# Patient Record
Sex: Male | Born: 1960 | Race: White | Hispanic: No | Marital: Married | Smoking: Current every day smoker
Health system: Southern US, Community
[De-identification: ages and names within clinical notes are randomized; demographics above are authoritative.]

## PROBLEM LIST (undated history)

## (undated) HISTORY — PX: NO PAST SURGERIES: SHX2092

---

## 2008-09-20 ENCOUNTER — Emergency Department: Payer: Self-pay | Admitting: Emergency Medicine

## 2008-09-22 ENCOUNTER — Emergency Department: Payer: Self-pay | Admitting: Emergency Medicine

## 2008-09-30 ENCOUNTER — Emergency Department: Payer: Self-pay | Admitting: Emergency Medicine

## 2008-10-05 ENCOUNTER — Emergency Department: Payer: Self-pay | Admitting: Emergency Medicine

## 2013-03-19 ENCOUNTER — Ambulatory Visit: Payer: Self-pay | Admitting: Family Medicine

## 2014-02-04 ENCOUNTER — Emergency Department: Payer: Self-pay | Admitting: Student

## 2014-02-04 LAB — CBC
HCT: 50.5 % (ref 40.0–52.0)
HGB: 16.9 g/dL (ref 13.0–18.0)
MCH: 32.4 pg (ref 26.0–34.0)
MCHC: 33.5 g/dL (ref 32.0–36.0)
MCV: 97 fL (ref 80–100)
PLATELETS: 191 10*3/uL (ref 150–440)
RBC: 5.22 10*6/uL (ref 4.40–5.90)
RDW: 12.1 % (ref 11.5–14.5)
WBC: 6 10*3/uL (ref 3.8–10.6)

## 2014-02-04 LAB — COMPREHENSIVE METABOLIC PANEL
ALK PHOS: 95 U/L
Albumin: 3.9 g/dL (ref 3.4–5.0)
Anion Gap: 8 (ref 7–16)
BUN: 13 mg/dL (ref 7–18)
Bilirubin,Total: 0.3 mg/dL (ref 0.2–1.0)
CALCIUM: 8.7 mg/dL (ref 8.5–10.1)
CREATININE: 0.92 mg/dL (ref 0.60–1.30)
Chloride: 108 mmol/L — ABNORMAL HIGH (ref 98–107)
Co2: 24 mmol/L (ref 21–32)
EGFR (African American): >60
EGFR (Non-African Amer.): >60
GLUCOSE: 78 mg/dL (ref 65–99)
Osmolality: 278 (ref 275–301)
POTASSIUM: 3.8 mmol/L (ref 3.5–5.1)
SGOT(AST): 17 U/L (ref 15–37)
SGPT (ALT): 25 U/L
Sodium: 140 mmol/L (ref 136–145)
TOTAL PROTEIN: 7.8 g/dL (ref 6.4–8.2)

## 2014-02-04 LAB — URINALYSIS, COMPLETE
BLOOD: NEGATIVE
Bacteria: NONE SEEN
Bilirubin,UR: NEGATIVE
GLUCOSE, UR: NEGATIVE mg/dL (ref 0–75)
Ketone: NEGATIVE
Leukocyte Esterase: NEGATIVE
Nitrite: NEGATIVE
Ph: 5 (ref 4.5–8.0)
Protein: NEGATIVE
RBC,UR: <1 /HPF (ref 0–5)
SPECIFIC GRAVITY: 1.013 (ref 1.003–1.030)
Squamous Epithelial: NONE SEEN

## 2014-02-04 LAB — LIPASE, BLOOD: Lipase: 159 U/L (ref 73–393)

## 2014-02-19 ENCOUNTER — Ambulatory Visit: Payer: Self-pay | Admitting: Gastroenterology

## 2014-02-19 LAB — HM COLONOSCOPY

## 2014-03-18 DIAGNOSIS — M47816 Spondylosis without myelopathy or radiculopathy, lumbar region: Secondary | ICD-10-CM | POA: Insufficient documentation

## 2014-03-18 DIAGNOSIS — M4150 Other secondary scoliosis, site unspecified: Secondary | ICD-10-CM

## 2014-03-19 ENCOUNTER — Encounter: Payer: Self-pay | Admitting: *Deleted

## 2014-03-22 ENCOUNTER — Ambulatory Visit: Payer: Self-pay | Admitting: Family Medicine

## 2014-04-01 ENCOUNTER — Ambulatory Visit: Payer: Self-pay | Admitting: General Surgery

## 2014-04-15 ENCOUNTER — Ambulatory Visit: Payer: Self-pay | Admitting: Gastroenterology

## 2014-07-24 NOTE — Consult Note (Signed)
PATIENT NAME:  Christopher Rosario, Thedore MR#:  [id number] DATE OF BIRTH:  1960/06/15  DATE OF CONSULTATION:  02/04/2014  REFERRING PHYSICIAN:   CONSULTING PHYSICIAN:  Quentin Orealph L. Ely III, MD   CHIEF COMPLAINT: Abdominal pain.   BRIEF HISTORY:  Christopher BaileyMark Shannon is a 54 year old gentleman seen in the Emergency Room with a 24-hour history of abdominal pain. The patient's pain is primarily right lower quadrant, right side and right flank. He had no GI symptoms.  He did have one episode of mild diarrhea, but no nausea, vomiting or anorexia.  He ate normally until this morning.  He denied any fever or chills. Pain became intense this afternoon and he presented to the Emergency Room for further evaluation. Denies any other significant GI problems. He has no history of hepatitis, yellow jaundice, pancreatitis, peptic ulcer disease, gallbladder disease or diverticulitis. He has had no previous abdominal surgery.  No previous colonoscopy.  He does not see physicians regularly.   PAST MEDICAL HISTORY:  He has no major medical problems including no history of hypertension, cardiac disease, diabetes or thyroid problems.   MEDICATIONS:  He takes no medications regularly.   ALLERGIES:  He has no medical allergies.   SOCIAL HISTORY:  He is a cigarette smoker smoking pack and a half of cigarettes a day and does drink alcohol regularly and had a drinking problem many years ago. He works as an Personnel officerelectrician.   Approximately 30 years ago as a young man he had a similar episode of right lower quadrant pain which was treated with antibiotics as possible cecal infection.  His symptoms resolved.  He had no further problems.   DIAGNOSTIC DATA:  A CT scan was performed in the Emergency Room and revealed a thickened appendix dilated to 9 mm with some possible periappendiceal stranding. The possibility of an acute early appendicitis was identified and the surgical service was consulted.  White blood cell count was 6000.   REVIEW OF SYSTEMS:   Otherwise unremarkable.  He denies any voiding symptoms or problems.   PHYSICAL EXAMINATION: GENERAL:  He is AN alert, relatively comfortable gentleman in no significant distress.  He denies any abdominal pain at the present time.  VITALS:  Blood pressure 164/100, heart rate 72 and regular.  He is afebrile.  HEENT:  Unremarkable.  No scleral icterus.  No pupillary abnormalities.  NECK:  Supple, nontender with no adenopathy.  CHEST:  Clear with no adventitious sounds.  He has normal pulmonary excursion.  CARDIAC:  No murmurs or gallops to my ear.  He seems to be in normal sinus rhythm. ABDOMEN:  His abdomen is benign with no abdominal tenderness.  No rebound. No guarding. No point tenderness.  It is otherwise soft with active bowel sounds.   EXTREMITIES:  Exam reveals full range of motion. No deformities.  PSYCHIATRIC:  Normal orientation.  Normal affect.   DISCUSSION:  I have independently reviewed his CT scan. He does have some thickness of his appendix with some mild periappendiceal changes. I discussed surgical intervention with him versus possible observation with antibiotic therapy.  He does not want either option at this point.  He feels like he is much improved.  These changes on CT scan could be related to his previous right lower quadrant infection 30 years ago.    I have recommended observation and antibiotic therapy at the very minimum and he has declined.  We will put him on oral antibiotics, pain medicine and discharge him home with instructions to follow up in  our office next week or sooner if his symptoms do not improve.    His family is present for the interview and are aware of the plans and potential risks.    ____________________________ Quentin Ore III, MD rle:jw D: 02/04/2014 13:36:17 ET T: 02/04/2014 13:46:40 ET JOB#: [id number]  cc: Carmie End, MD, <Dictator> Steele Sizer, MD  Quentin Ore MD ELECTRONICALLY SIGNED 02/10/2014 13:46

## 2015-05-04 IMAGING — CT CT ABD-PELV W/ CM
2 of 5 series · 16 of 46 positions shown, 18 images · IV contrast (omnipaque)
Comparison: 02/04/2014

CLINICAL DATA: Right lower quadrant abdominal pain, worsening for 6
weeks with constipation.

EXAM:
CT ABDOMEN AND PELVIS WITH CONTRAST
TECHNIQUE: Multidetector CT imaging of the abdomen and pelvis was performed
using the standard protocol following bolus administration of
intravenous contrast.
CONTRAST:  100 mL Omnipaque 300

[Series 2: routine abd pel with · axial · 0.67mm/px · z∈[-272,+108]mm · 13 of 86 slices shown, 15 images]
[im 5/86  soft-tissue]
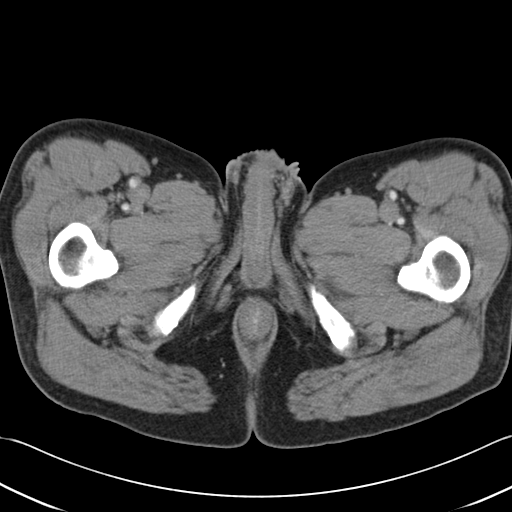
[im 5/86  bone]
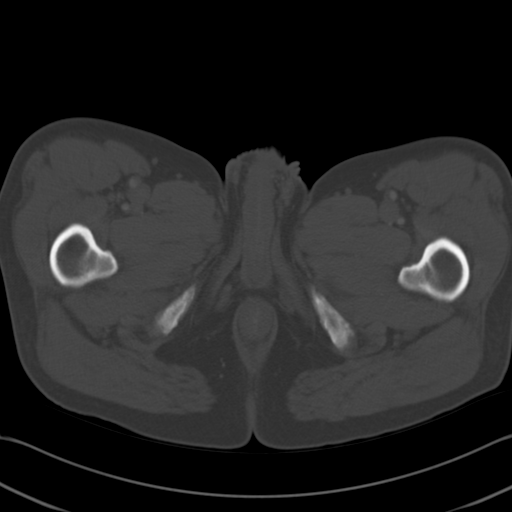
[im 14/86  soft-tissue]
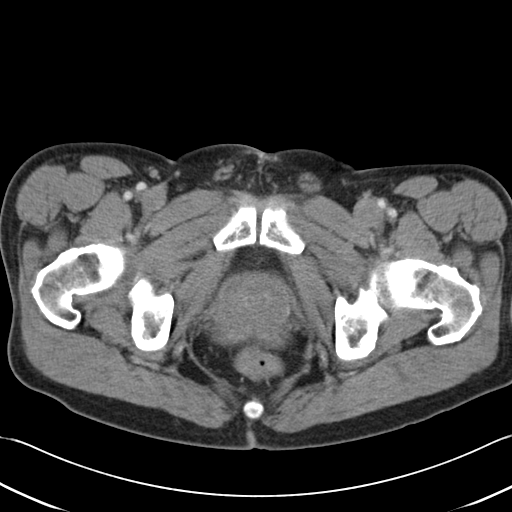
[im 18/86  soft-tissue]
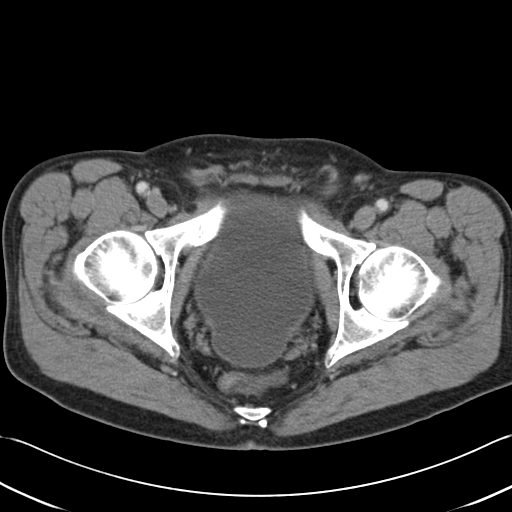
[im 23/86  soft-tissue]
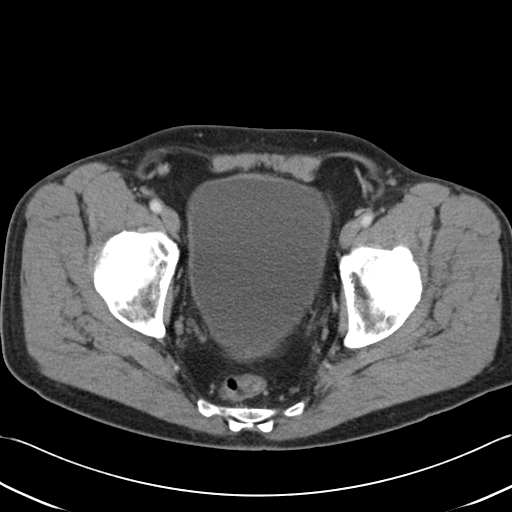
[im 32/86  soft-tissue]
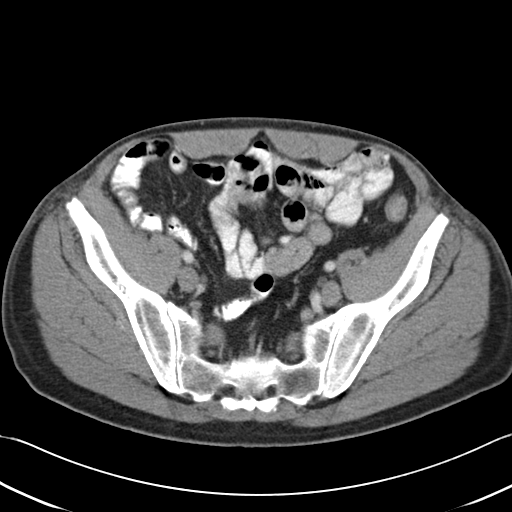
[im 36/86  soft-tissue]
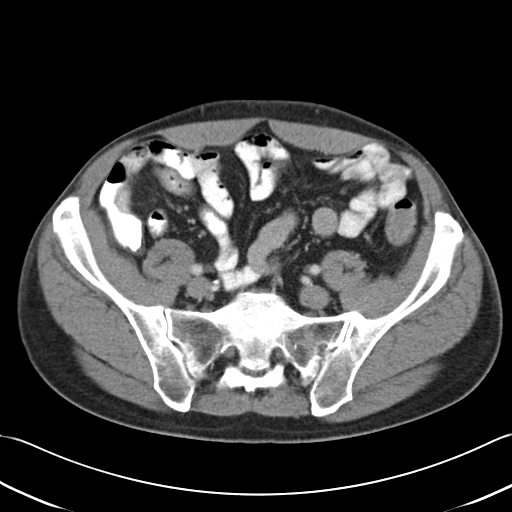
[im 45/86  soft-tissue]
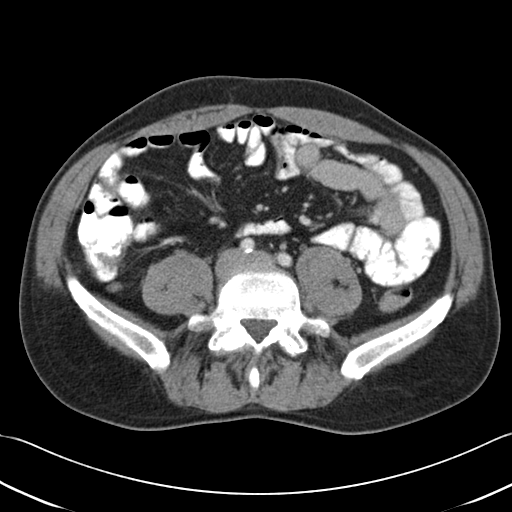
[im 50/86  soft-tissue]
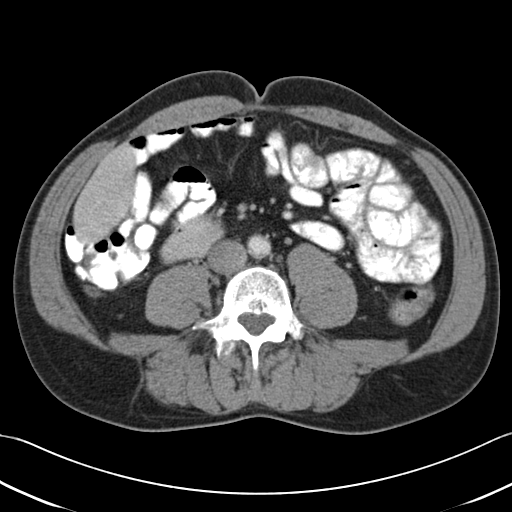
[im 54/86  soft-tissue]
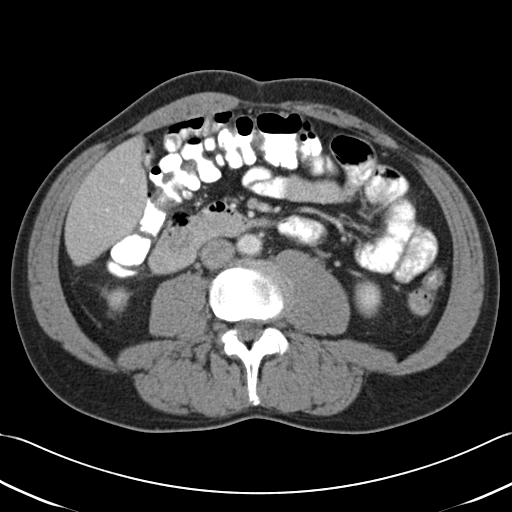
[im 54/86  bone]
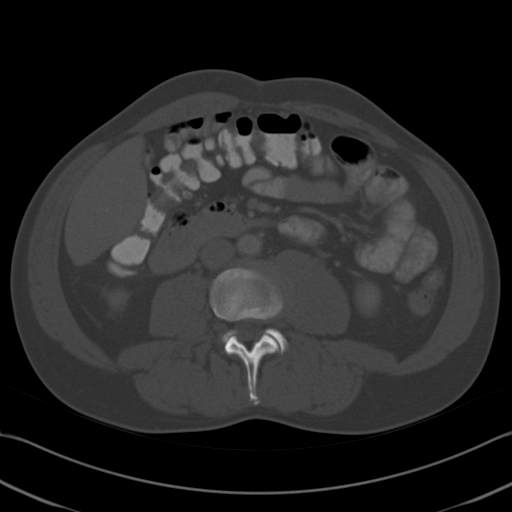
[im 63/86  soft-tissue]
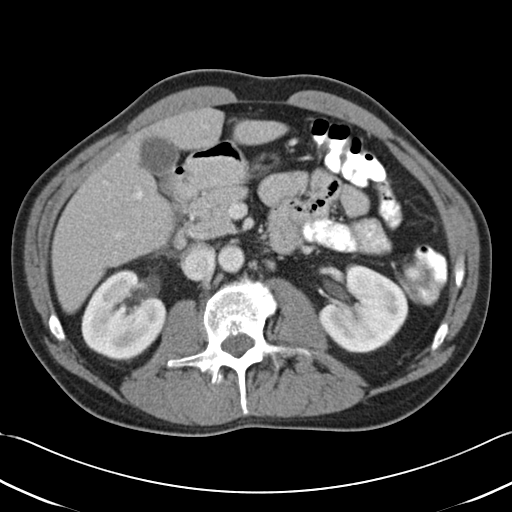
[im 68/86  soft-tissue]
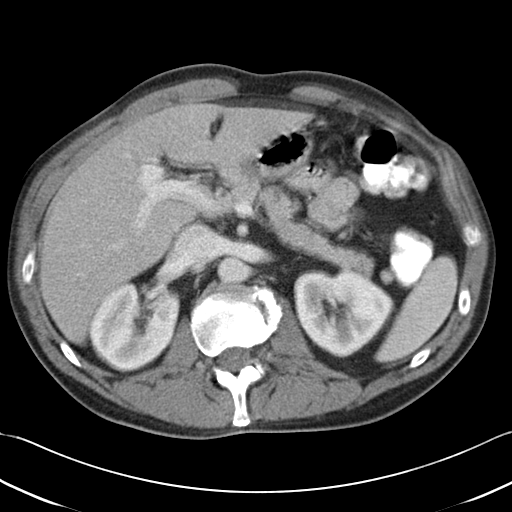
[im 72/86  soft-tissue]
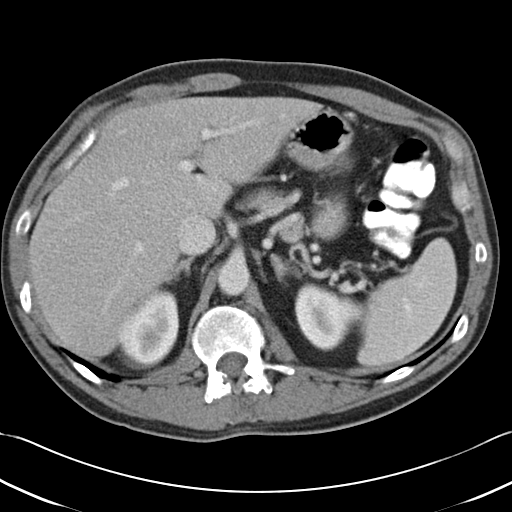
[im 81/86  soft-tissue]
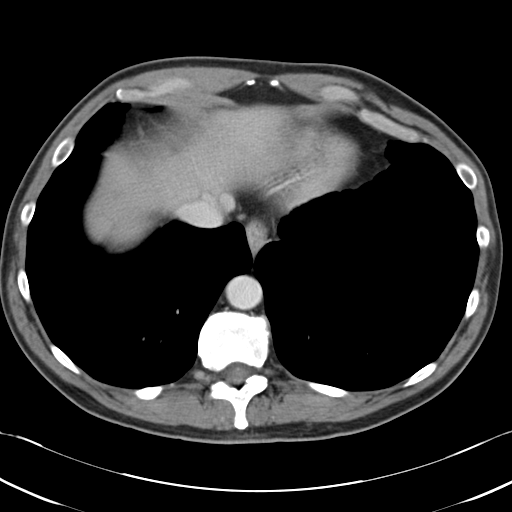

[Series 6: cor routine abd pel with · coronal · 0.70mm/px · 3 of 140 slices shown]
[im 47/140  soft-tissue]
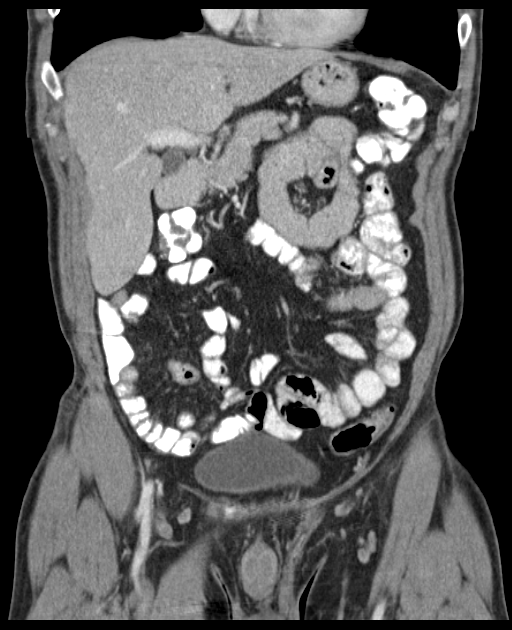
[im 62/140  soft-tissue]
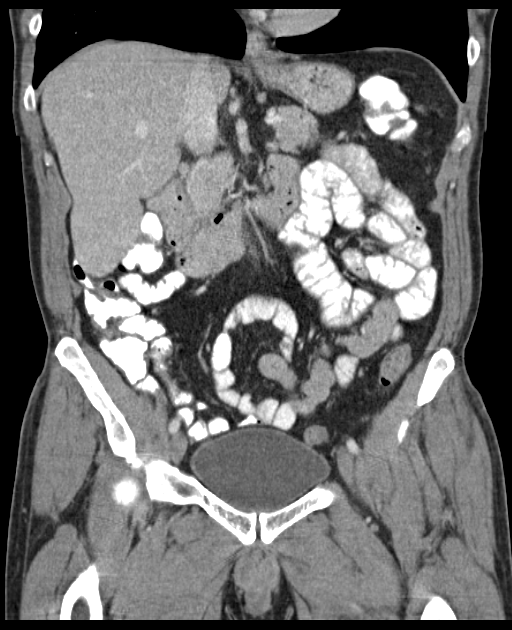
[im 78/140  soft-tissue]
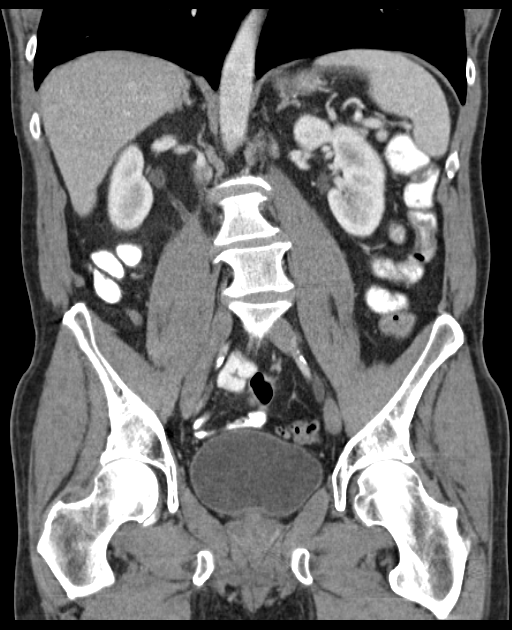

[16 of 46 positions shown; findings below may reference images not displayed]

FINDINGS: Nodules in the left lung base are unchanged measuring 7 mm in the
lingula and 5 mm in the left lower lobe. There is no pleural
effusion.

Diffusely low attenuation of the liver is compatible with mild
steatosis. The gallbladder, spleen, adrenal glands, kidneys, and
pancreas have an unremarkable enhanced appearance.

Mild retroperitoneal stranding about the second portion of the
duodenum is similar to the prior study. There may be an associated
small duodenal diverticulum in this region. There is no evidence of
frank perforation, however a microperforation cannot be completely
excluded given 1 or 2 small locules of gas which cannot be
completely confirmed to be intraluminal.

Oral contrast is present in loops of nondilated small and large
bowel without evidence of obstruction. Mild thickening of the
appendix is similar to the prior study, measuring up to 8 mm in
diameter. No significant surrounding inflammatory changes are
identified. Sigmoid colonic diverticulosis is noted without evidence
of acute diverticulitis.

Mild aortoiliac atherosclerotic calcification is noted. No free
fluid or enlarged lymph nodes are identified. Bladder is
unremarkable. Lumbar spondylosis is noted with mild thoracolumbar
dextroscoliosis.
IMPRESSION: 1. Unchanged, mild thickening of the appendix without significant
surrounding inflammatory changes to clearly indicate acute
appendicitis.
2. Persistent inflammatory stranding about the second portion of the
duodenum, possibly representing duodenitis. Microperforation, such
as from a duodenal ulcer, cannot be completely excluded.

## 2016-12-26 ENCOUNTER — Ambulatory Visit (INDEPENDENT_AMBULATORY_CARE_PROVIDER_SITE_OTHER): Payer: 59 | Admitting: Physician Assistant

## 2016-12-26 VITALS — BP 136/72 | HR 88 | Temp 99.0°F | Resp 16 | Ht 69.0 in | Wt 155.0 lb

## 2016-12-26 DIAGNOSIS — J0110 Acute frontal sinusitis, unspecified: Secondary | ICD-10-CM | POA: Diagnosis not present

## 2016-12-26 DIAGNOSIS — Z87891 Personal history of nicotine dependence: Secondary | ICD-10-CM | POA: Diagnosis not present

## 2016-12-26 DIAGNOSIS — Z720 Tobacco use: Secondary | ICD-10-CM

## 2016-12-26 MED ORDER — AMOXICILLIN 875 MG PO TABS: 875.0000 mg | ORAL_TABLET | Freq: Two times a day (BID) | ORAL | 0 refills | Status: DC

## 2016-12-26 NOTE — Progress Notes (Signed)
[facility]  Chief Complaint  Patient presents with  . Sinusitis    Started five days ago.    Subjective:    Patient ID: Christopher Rosario, male    DOB: [birth year], 56 y.o.   MRN: [id number]  Upper Respiratory Infection: Christopher Rosario is a 56 y.o. male with a past medical history significant for 35 pack year smoking,complaining of symptoms of a URI, possible sinusitis. Symptoms include left ear pain, congestion, cough and sore throat. Onset of symptoms was 5 days ago, gradually worsening since that time. He felt he got better, then got worse after 5 days. He also c/o left ear pressure/pain, congestion, cough described as productive and lightheadedness for the past 1 day .  He is drinking plenty of fluids. Evaluation to date: none. Treatment to date: cough suppressants. The treatment has provided moderate relief..   Review of Systems  Constitutional: Positive for diaphoresis and fatigue. Negative for activity change, appetite change, chills, fever and unexpected weight change.  HENT: Positive for congestion, ear pain (Left ear pressure), postnasal drip, rhinorrhea, sinus pain, sinus pressure, sneezing, sore throat and voice change. Negative for ear discharge, nosebleeds, tinnitus and trouble swallowing.   Eyes: Negative for photophobia, pain, discharge, redness, itching and visual disturbance.  Respiratory: Positive for cough, chest tightness and wheezing. Negative for apnea, choking, shortness of breath and stridor.   Gastrointestinal: Negative.   Allergic/Immunologic: Positive for environmental allergies.  Neurological: Positive for headaches. Negative for dizziness and light-headedness.       Objective:   BP 136/72 (BP Location: Left Arm, Patient Position: Sitting, Cuff Size: Normal)   Pulse 88   Temp 99 F (37.2 C) (Oral)   Resp 16   Ht  (1.753 m)   Wt 155 lb (70.3 kg)   BMI 22.89 kg/m   Patient Active Problem List   Diagnosis Date Noted   . Degenerative scoliosis in adult patient 03/18/2014  . Spondylosis of lumbar region without myelopathy or radiculopathy 03/18/2014    Outpatient Encounter Prescriptions as of 12/26/2016  Medication Sig  . amoxicillin (AMOXIL) 875 MG tablet Take 1 tablet (875 mg total) by mouth 2 (two) times daily.   No facility-administered encounter medications on file as of 12/26/2016.     No Known Allergies     Physical Exam  Constitutional: He is oriented to person, place, and time. He appears well-developed and well-nourished.  HENT:  Mouth/Throat: Oropharynx is clear and moist. No oropharyngeal exudate.  TMs opaque bilaterally.   Eyes: Conjunctivae are normal.  Neck: Neck supple.  Cardiovascular: Normal rate and regular rhythm.   Pulmonary/Chest: Effort normal and breath sounds normal.  Lymphadenopathy:    He has cervical adenopathy.  Neurological: He is alert and oriented to person, place, and time.  Skin: Skin is warm and dry.  Psychiatric: He has a normal mood and affect. His behavior is normal.       Assessment & Plan:   1. Acute non-recurrent frontal sinusitis  - amoxicillin (AMOXIL) 875 MG tablet; Take 1 tablet (875 mg total) by mouth 2 (two) times daily.  Dispense: 20 tablet; Refill: 0  2. Tobacco abuse  35 pack year smoking history, still currently. Discussed for 3 minutes smoking cessation, need for low dose CT screening.   Recommend rest, fluids, frequent hand washing. Work note provided  Patient Instructions  Sinusitis, Adult Sinusitis is soreness and inflammation of your sinuses. Sinuses are hollow spaces in the bones around your face. They are located:  Around your eyes.  In the middle of your forehead.  Behind your nose.  In your cheekbones.  Your sinuses and nasal passages are lined with a stringy fluid (mucus). Mucus normally drains out of your sinuses. When your nasal tissues get inflamed or swollen, the mucus can get trapped or blocked so air cannot  flow through your sinuses. This lets bacteria, viruses, and funguses grow, and that leads to infection. Follow these instructions at home: Medicines  Take, use, or apply over-the-counter and prescription medicines only as told by your doctor. These may include nasal sprays.  If you were prescribed an antibiotic medicine, take it as told by your doctor. Do not stop taking the antibiotic even if you start to feel better. Hydrate and Humidify  Drink enough water to keep your pee (urine) clear or pale yellow.  Use a cool mist humidifier to keep the humidity level in your home above 50%.  Breathe in steam for 10-15 minutes, 3-4 times a day or as told by your doctor. You can do this in the bathroom while a hot shower is running.  Try not to spend time in cool or dry air. Rest  Rest as much as possible.  Sleep with your head raised (elevated).  Make sure to get enough sleep each night. General instructions  Put a warm, moist washcloth on your face 3-4 times a day or as told by your doctor. This will help with discomfort.  Wash your hands often with soap and water. If there is no soap and water, use hand sanitizer.  Do not smoke. Avoid being around people who are smoking (secondhand smoke).  Keep all follow-up visits as told by your doctor. This is important. Contact a doctor if:  You have a fever.  Your symptoms get worse.  Your symptoms do not get better within 10 days. Get help right away if:  You have a very bad headache.  You cannot stop throwing up (vomiting).  You have pain or swelling around your face or eyes.  You have trouble seeing.  You feel confused.  Your neck is stiff.  You have trouble breathing. This information is not intended to replace advice given to you by your health care provider. Make sure you discuss any questions you have with your health care provider. Document Released: 09/05/2007 Document Revised: 11/13/2015 Document Reviewed:  01/12/2015 Elsevier Interactive Patient Education  [employer].     The entirety of the information documented in the History of Present Illness, Review of Systems and Physical Exam were personally obtained by me. Portions of this information were initially documented by Kavin Leech, CMA and reviewed by me for thoroughness and accuracy.

## 2016-12-26 NOTE — Patient Instructions (Signed)

## 2016-12-27 ENCOUNTER — Telehealth: Payer: Self-pay | Admitting: *Deleted

## 2016-12-27 DIAGNOSIS — Z87891 Personal history of nicotine dependence: Secondary | ICD-10-CM

## 2016-12-27 DIAGNOSIS — Z122 Encounter for screening for malignant neoplasm of respiratory organs: Secondary | ICD-10-CM

## 2016-12-27 NOTE — Telephone Encounter (Signed)
Received referral for initial lung cancer screening scan. Contacted patient and obtained smoking history,(current, 35 pack year) as well as answering questions related to screening process. Patient denies signs of lung cancer such as weight loss or hemoptysis. Patient denies comorbidity that would prevent curative treatment if lung cancer were found. Patient is scheduled for shared decision making visit and CT scan on 01/01/17.

## 2016-12-31 ENCOUNTER — Encounter: Payer: Self-pay | Admitting: Physician Assistant

## 2017-01-01 ENCOUNTER — Encounter: Payer: Self-pay | Admitting: Oncology

## 2017-01-01 ENCOUNTER — Ambulatory Visit: Admission: RE | Admit: 2017-01-01 | Payer: 59 | Source: Ambulatory Visit

## 2017-01-01 ENCOUNTER — Inpatient Hospital Stay: Payer: 59 | Admitting: Oncology

## 2017-01-02 ENCOUNTER — Telehealth: Payer: Self-pay | Admitting: *Deleted

## 2017-01-02 NOTE — Telephone Encounter (Signed)
Patient previously scheduled for CT screening scan to be rescheduled per his request.

## 2017-01-09 ENCOUNTER — Ambulatory Visit: Admission: RE | Admit: 2017-01-09 | Payer: 59 | Source: Ambulatory Visit

## 2017-01-09 ENCOUNTER — Inpatient Hospital Stay: Payer: 59 | Attending: Nurse Practitioner | Admitting: Nurse Practitioner

## 2017-01-21 ENCOUNTER — Telehealth: Payer: Self-pay | Admitting: *Deleted

## 2017-01-21 NOTE — Telephone Encounter (Signed)
Attempted to contact patient regarding no show for lung screening appt. Unable to leave voicemail. Will mail letter.

## 2017-03-01 ENCOUNTER — Telehealth: Payer: Self-pay | Admitting: *Deleted

## 2017-03-01 NOTE — Telephone Encounter (Signed)
Received referral for low dose lung cancer screening CT scan. Previous attempts to coordinate Ct and shared decision making visit have been unsuccessful. Attempted to leave message at phone number listed in EMR for patient to call me back to facilitate scheduling scan. However, this option is not available. Will mail notification.

## 2017-03-13 ENCOUNTER — Encounter: Payer: Self-pay | Admitting: *Deleted

## 2018-02-10 ENCOUNTER — Encounter: Payer: Self-pay | Admitting: Physician Assistant

## 2018-02-10 ENCOUNTER — Ambulatory Visit (INDEPENDENT_AMBULATORY_CARE_PROVIDER_SITE_OTHER): Payer: 59 | Admitting: Physician Assistant

## 2018-02-10 VITALS — BP 180/110 | HR 71 | Temp 98.5°F | Wt 155.6 lb

## 2018-02-10 DIAGNOSIS — I10 Essential (primary) hypertension: Secondary | ICD-10-CM | POA: Diagnosis not present

## 2018-02-10 DIAGNOSIS — M542 Cervicalgia: Secondary | ICD-10-CM | POA: Diagnosis not present

## 2018-02-10 DIAGNOSIS — Z1159 Encounter for screening for other viral diseases: Secondary | ICD-10-CM | POA: Diagnosis not present

## 2018-02-10 DIAGNOSIS — Z720 Tobacco use: Secondary | ICD-10-CM

## 2018-02-10 DIAGNOSIS — Z114 Encounter for screening for human immunodeficiency virus [HIV]: Secondary | ICD-10-CM

## 2018-02-10 MED ORDER — VARENICLINE TARTRATE 0.5 MG X 11 & 1 MG X 42 PO MISC: ORAL | 0 refills | Status: DC

## 2018-02-10 MED ORDER — AMLODIPINE BESYLATE 5 MG PO TABS: 5.0000 mg | ORAL_TABLET | Freq: Every day | ORAL | 0 refills | Status: DC

## 2018-02-10 MED ORDER — CYCLOBENZAPRINE HCL 5 MG PO TABS: 5.0000 mg | ORAL_TABLET | Freq: Three times a day (TID) | ORAL | 0 refills | Status: DC | PRN

## 2018-02-10 NOTE — Patient Instructions (Signed)

## 2018-02-10 NOTE — Progress Notes (Signed)
Patient: Christopher Rosario Male    DOB: 09/27/1960   57 y.o.   MRN: [id number] Visit Date: 02/10/2018  Today's Provider: Trey Sailors, PA-C   Chief Complaint  Patient presents with  . Neck Pain   Subjective:    HPI  Neck Pain Patient presents today for right neck pain for about 1 month down to the right shoulder. Patient has been taking Goodiepowders and applying heating pads with significant relief especially after the heating pad. Heavy lifting past Monday and right side of neck bothered him badly and then got better. Worked around house, then describes waking up today with neck locked up. 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 Denies weakness in his right hand, loss of grip strength, dropping items.   Today, his blood pressure is elevated. Denies chest pain and headache. Says sometimes he will check his BP in the cuff at [facility] and it is 140's/90's.   BP Readings from Last 3 Encounters:  02/10/18 (!) 180/110  12/26/16 136/72   Tobacco abuse: He reports he is smoking 1.5 packs per day and has done so for at least 20 years. He qualifies for low dose CT scan which was ordered last year but not obtained. Patient is interested in quitting.     No Known Allergies   Current Outpatient Medications:  .  amoxicillin (AMOXIL) 875 MG tablet, Take 1 tablet (875 mg total) by mouth 2 (two) times daily., Disp: 20 tablet, Rfl: 0 .  amLODipine (NORVASC) 5 MG tablet, Take 1 tablet (5 mg total) by mouth daily., Disp: 90 tablet, Rfl: 0 .  cyclobenzaprine (FLEXERIL) 5 MG tablet, Take 1 tablet (5 mg total) by mouth 3 (three) times daily as needed for muscle spasms., Disp: 30 tablet, Rfl: 0 .  varenicline (CHANTIX STARTING MONTH PAK) 0.5 MG X 11 & 1 MG X 42 tablet, One 0.5 mg tablet by mouth 1x for 3 days, then increase to one 0.5 mg tablet twice daily for 4 days, then increase to one 1 mg tab 2x daily., Disp: 53 tablet, Rfl: 0  Review of Systems  Constitutional: Negative.   HENT:  Negative.   Gastrointestinal: Negative.   Genitourinary: Negative.   Musculoskeletal: Positive for arthralgias, back pain, myalgias, neck pain and neck stiffness.  Allergic/Immunologic: Negative.     Social History   Tobacco Use  . Smoking status: Current Every Day Smoker    Packs/day: 1.00    Years: 35.00    Pack years: 35.00    Types: Cigarettes  . Smokeless tobacco: Never Used  Substance Use Topics  . Alcohol use: Yes    Alcohol/week: 7.0 standard drinks    Types: 7 Cans of beer per week   Objective:   BP (!) 180/110   Pulse 71   Temp 98.5 F (36.9 C) (Oral)   Wt 155 lb 9.6 oz (70.6 kg)   SpO2 97%   BMI 22.98 kg/m  Vitals:   02/10/18 1343 02/10/18 1513  BP: (!) 197/122 (!) 180/110  Pulse: 71   Temp: 98.5 F (36.9 C)   TempSrc: Oral   SpO2: 97%   Weight: 155 lb 9.6 oz (70.6 kg)      Physical Exam  Constitutional: He is oriented to person, place, and time. He appears well-developed and well-nourished.  Cardiovascular: Normal rate and regular rhythm.  Pulmonary/Chest: Effort normal and breath sounds normal.  Musculoskeletal: He exhibits no edema, tenderness or deformity.  Impingement signs negative bilaterally. 5/5 upper  extremity strength bilaterally.   Neurological: He is alert and oriented to person, place, and time.  Skin: Skin is warm and dry.  Psychiatric: He has a normal mood and affect. His behavior is normal.        Assessment & Plan:     1. Neck pain  Suspect muscular. Muscle relaxers as needed, advised to continue with warm heat. Printed out neck stretching exercises.  - cyclobenzaprine (FLEXERIL) 5 MG tablet; Take 1 tablet (5 mg total) by mouth 3 (three) times daily as needed for muscle spasms.  Dispense: 30 tablet; Refill: 0  2. Hypertension, unspecified type  Extremely elevated today and on recheck. Patient says BP's in [facility] red 140's/90's and so will treat for HTN.  - Comprehensive Metabolic Panel (CMET) - Lipid Profile - CBC with  Differential - amLODipine (NORVASC) 5 MG tablet; Take 1 tablet (5 mg total) by mouth daily.  Dispense: 90 tablet; Refill: 0  3. Tobacco abuse  Counseled >3 min about smoking cessation and have decided to start on Chantix.   - varenicline (CHANTIX STARTING MONTH PAK) 0.5 MG X 11 & 1 MG X 42 tablet; One 0.5 mg tablet by mouth 1x for 3 days, then increase to one 0.5 mg tablet twice daily for 4 days, then increase to one 1 mg tab 2x daily.  Dispense: 53 tablet; Refill: 0  4. Need for hepatitis C screening test  - Hepatitis C antibody  5. Encounter for screening for HIV  - HIV antibody (with reflex)  Return in about 2 weeks (around 02/24/2018) for htn.  The entirety of the information documented in the History of Present Illness, Review of Systems and Physical Exam were personally obtained by me. Portions of this information were initially documented by Dara Lords, CMA and reviewed by me for thoroughness and accuracy.           Trey Sailors, PA-C  [facility]

## 2018-02-11 ENCOUNTER — Telehealth: Payer: Self-pay

## 2018-02-11 LAB — CBC WITH DIFFERENTIAL/PLATELET
Basophils Absolute: 0 10*3/uL (ref 0.0–0.2)
Basos: 1 %
EOS (ABSOLUTE): 0.1 10*3/uL (ref 0.0–0.4)
Eos: 2 %
Hematocrit: 48.7 % (ref 37.5–51.0)
Hemoglobin: 16.9 g/dL (ref 13.0–17.7)
Immature Grans (Abs): 0 10*3/uL (ref 0.0–0.1)
Immature Granulocytes: 0 %
Lymphocytes Absolute: 1.6 10*3/uL (ref 0.7–3.1)
Lymphs: 25 %
MCH: 32.6 pg (ref 26.6–33.0)
MCHC: 34.7 g/dL (ref 31.5–35.7)
MCV: 94 fL (ref 79–97)
Monocytes Absolute: 0.6 10*3/uL (ref 0.1–0.9)
Monocytes: 10 %
Neutrophils Absolute: 4.1 10*3/uL (ref 1.4–7.0)
Neutrophils: 62 %
Platelets: 217 10*3/uL (ref 150–450)
RBC: 5.19 x10E6/uL (ref 4.14–5.80)
RDW: 11.6 % — ABNORMAL LOW (ref 12.3–15.4)
WBC: 6.5 10*3/uL (ref 3.4–10.8)

## 2018-02-11 LAB — COMPREHENSIVE METABOLIC PANEL
ALT: 15 IU/L (ref 0–44)
AST: 17 IU/L (ref 0–40)
Albumin/Globulin Ratio: 1.8 (ref 1.2–2.2)
Albumin: 4.6 g/dL (ref 3.5–5.5)
Alkaline Phosphatase: 94 IU/L (ref 39–117)
BUN/Creatinine Ratio: 16 (ref 9–20)
BUN: 19 mg/dL (ref 6–24)
Bilirubin Total: <0.2 mg/dL (ref 0.0–1.2)
CO2: 18 mmol/L — ABNORMAL LOW (ref 20–29)
Calcium: 9.6 mg/dL (ref 8.7–10.2)
Chloride: 105 mmol/L (ref 96–106)
Creatinine, Ser: 1.22 mg/dL (ref 0.76–1.27)
GFR calc Af Amer: 76 mL/min/{1.73_m2} (ref >59)
GFR calc non Af Amer: 65 mL/min/{1.73_m2} (ref >59)
Globulin, Total: 2.6 g/dL (ref 1.5–4.5)
Glucose: 98 mg/dL (ref 65–99)
Potassium: 4.3 mmol/L (ref 3.5–5.2)
Sodium: 143 mmol/L (ref 134–144)
Total Protein: 7.2 g/dL (ref 6.0–8.5)

## 2018-02-11 LAB — HIV ANTIBODY (ROUTINE TESTING W REFLEX): HIV Screen 4th Generation wRfx: NONREACTIVE

## 2018-02-11 LAB — HEPATITIS C ANTIBODY: Hep C Virus Ab: <0.1 s/co ratio (ref 0.0–0.9)

## 2018-02-11 LAB — LIPID PANEL
Chol/HDL Ratio: 4.4 ratio (ref 0.0–5.0)
Cholesterol, Total: 262 mg/dL — ABNORMAL HIGH (ref 100–199)
HDL: 59 mg/dL (ref >39)
LDL Calculated: 165 mg/dL — ABNORMAL HIGH (ref 0–99)
Triglycerides: 191 mg/dL — ABNORMAL HIGH (ref 0–149)
VLDL Cholesterol Cal: 38 mg/dL (ref 5–40)

## 2018-02-11 NOTE — Telephone Encounter (Signed)
-----   Message from Trey SailorsAdriana M Pollak, [place]PA-C sent at 02/11/2018  8:29 AM EST ----- CMET shows normal sugars, kidney and liver function. CBC normal. Cholesterol very elevated, will have to talk about cholesterol medication at next follow up. HIV and hepatitis C are negative.

## 2018-02-12 NOTE — Telephone Encounter (Signed)
Patient advised as below.  

## 2018-02-24 ENCOUNTER — Encounter: Payer: Self-pay | Admitting: Physician Assistant

## 2018-02-24 ENCOUNTER — Ambulatory Visit (INDEPENDENT_AMBULATORY_CARE_PROVIDER_SITE_OTHER): Payer: 59 | Admitting: Physician Assistant

## 2018-02-24 VITALS — BP 150/90 | HR 72 | Temp 97.8°F | Resp 20 | Wt 160.0 lb

## 2018-02-24 DIAGNOSIS — I10 Essential (primary) hypertension: Secondary | ICD-10-CM | POA: Insufficient documentation

## 2018-02-24 DIAGNOSIS — E785 Hyperlipidemia, unspecified: Secondary | ICD-10-CM

## 2018-02-24 HISTORY — DX: Essential (primary) hypertension: I10

## 2018-02-24 MED ORDER — AMLODIPINE BESYLATE 10 MG PO TABS: 10.0000 mg | ORAL_TABLET | Freq: Every day | ORAL | 0 refills | Status: DC

## 2018-02-24 MED ORDER — ATORVASTATIN CALCIUM 10 MG PO TABS: 10.0000 mg | ORAL_TABLET | Freq: Every day | ORAL | 0 refills | Status: DC

## 2018-02-24 NOTE — Patient Instructions (Signed)

## 2018-02-24 NOTE — Progress Notes (Signed)
Established Patient Office Visit  Subjective:  Patient ID: Christopher Rosario, male    DOB: 1960-07-09  Age: 57 y.o. MRN: [id number]  CC:  Chief Complaint  Patient presents with  . Follow-up    HPI Christopher Rosario presents for   Hypertension, follow-up:  BP Readings from Last 3 Encounters:  02/24/18 (!) 150/90  02/10/18 (!) 180/110  12/26/16 136/72    He was last seen for hypertension 2 weeks ago.  BP at that visit was 180/110. Management changes since that visit include starting amlodipine 5 mg.  He reports excellent compliance with treatment. He is having side effects. Dry mouth He is not exercising. He is not adherent to low salt diet.   Outside blood pressures are still elevated. He is experiencing none.  Patient denies chest pain and lower extremity edema.   Cardiovascular risk factors include hypertension and smoking/ tobacco exposure.  Use of agents associated with hypertension: none.     Weight trend: stable Wt Readings from Last 3 Encounters:  02/24/18 160 lb (72.6 kg)  02/10/18 155 lb 9.6 oz (70.6 kg)  12/26/16 155 lb (70.3 kg)    Current diet: in general, a "healthy" diet    HLD  The 10-year ASCVD risk score Denman George DC Jr., et al., 2013) is: 20.1%   Values used to calculate the score:     Age: 75 years     Sex: Male     Is Non-Hispanic African American: No     Diabetic: No     Tobacco smoker: Yes     Systolic Blood Pressure: 150 mmHg     Is BP treated: Yes     HDL Cholesterol: 59 mg/dL     Total Cholesterol: 262 mg/dL   Lipid Panel     Component Value Date/Time   CHOL 262 (H) 02/10/2018 1424   TRIG 191 (H) 02/10/2018 1424   HDL 59 02/10/2018 1424   CHOLHDL 4.4 02/10/2018 1424   LDLCALC 165 (H) 02/10/2018 1424   Patient continues to smoke 1 pack a day. Patient drinks 2 alcoholic drinks per night.  ------------------------------------------------------------------------    No past medical history on file.  Past Surgical History:  Procedure  Laterality Date  . NO PAST SURGERIES      Family History  Problem Relation Age of Onset  . Stroke Mother   . Cancer Mother   . Breast cancer Mother   . Aneurysm Father   . Hypertension Father   . Healthy Sister   . Prostate cancer Maternal Uncle     Social History   Socioeconomic History  . Marital status: Married    Spouse name: Not on file  . Number of children: Not on file  . Years of education: Not on file  . Highest education level: Not on file  Occupational History  . Not on file  Social Needs  . Financial resource strain: Not on file  . Food insecurity:    Worry: Not on file    Inability: Not on file  . Transportation needs:    Medical: Not on file    Non-medical: Not on file  Tobacco Use  . Smoking status: Current Every Day Smoker    Packs/day: 1.00    Years: 35.00    Pack years: 35.00    Types: Cigarettes  . Smokeless tobacco: Never Used  Substance and Sexual Activity  . Alcohol use: Yes    Alcohol/week: 7.0 standard drinks    Types: 7 Cans of beer per  week  . Drug use: No  . Sexual activity: Not on file  Lifestyle  . Physical activity:    Days per week: Not on file    Minutes per session: Not on file  . Stress: Not on file  Relationships  . Social connections:    Talks on phone: Not on file    Gets together: Not on file    Attends religious service: Not on file    Active member of club or organization: Not on file    Attends meetings of clubs or organizations: Not on file    Relationship status: Not on file  . Intimate partner violence:    Fear of current or ex partner: Not on file    Emotionally abused: Not on file    Physically abused: Not on file    Forced sexual activity: Not on file  Other Topics Concern  . Not on file  Social History Narrative  . Not on file    Outpatient Medications Prior to Visit  Medication Sig Dispense Refill  . varenicline (CHANTIX STARTING MONTH PAK) 0.5 MG X 11 & 1 MG X 42 tablet One 0.5 mg tablet by mouth  1x for 3 days, then increase to one 0.5 mg tablet twice daily for 4 days, then increase to one 1 mg tab 2x daily. 53 tablet 0  . amLODipine (NORVASC) 5 MG tablet Take 1 tablet (5 mg total) by mouth daily. 90 tablet 0  . amoxicillin (AMOXIL) 875 MG tablet Take 1 tablet (875 mg total) by mouth 2 (two) times daily. 20 tablet 0  . cyclobenzaprine (FLEXERIL) 5 MG tablet Take 1 tablet (5 mg total) by mouth 3 (three) times daily as needed for muscle spasms. 30 tablet 0   No facility-administered medications prior to visit.     No Known Allergies  ROS Review of Systems  Constitutional: Negative.   Respiratory: Negative.   Cardiovascular: Negative.       Objective:    Physical Exam  BP (!) 150/90 (BP Location: Left Arm, Patient Position: Sitting, Cuff Size: Normal)   Pulse 72   Temp 97.8 F (36.6 C) (Oral)   Resp 20   Wt 160 lb (72.6 kg)   SpO2 96%   BMI 23.63 kg/m  Wt Readings from Last 3 Encounters:  02/24/18 160 lb (72.6 kg)  02/10/18 155 lb 9.6 oz (70.6 kg)  12/26/16 155 lb (70.3 kg)     Health Maintenance Due  Topic Date Due  . TETANUS/TDAP  08/21/1979  . INFLUENZA VACCINE  10/31/2017    There are no preventive care reminders to display for this patient.  No results found for: TSH Lab Results  Component Value Date   WBC 6.5 02/10/2018   HGB 16.9 02/10/2018   HCT 48.7 02/10/2018   MCV 94 02/10/2018   PLT 217 02/10/2018   Lab Results  Component Value Date   NA 143 02/10/2018   K 4.3 02/10/2018   CO2 18 (L) 02/10/2018   GLUCOSE 98 02/10/2018   BUN 19 02/10/2018   CREATININE 1.22 02/10/2018   BILITOT <0.2 02/10/2018   ALKPHOS 94 02/10/2018   AST 17 02/10/2018   ALT 15 02/10/2018   PROT 7.2 02/10/2018   ALBUMIN 4.6 02/10/2018   CALCIUM 9.6 02/10/2018   ANIONGAP 8 02/04/2014   Lab Results  Component Value Date   CHOL 262 (H) 02/10/2018   Lab Results  Component Value Date   HDL 59 02/10/2018   Lab Results  Component Value Date   LDLCALC 165 (H)  02/10/2018   Lab Results  Component Value Date   TRIG 191 (H) 02/10/2018   Lab Results  Component Value Date   CHOLHDL 4.4 02/10/2018   No results found for: HGBA1C    Assessment & Plan:  1. Hypertension, unspecified type  Increase amlodipine to 10 mg QD and see back in 6 weeks.  - amLODipine (NORVASC) 10 MG tablet; Take 1 tablet (10 mg total) by mouth daily.  Dispense: 90 tablet; Refill: 0  2. Hyperlipidemia, unspecified hyperlipidemia type  CVD risk ~20%, start statin as below and recheck labs in 6 weeks.   - atorvastatin (LIPITOR) 10 MG tablet; Take 1 tablet (10 mg total) by mouth daily.  Dispense: 90 tablet; Refill: 0  Follow-up: Return in about 6 weeks (around 04/07/2018) for HTN.    Trey Sailors, PA-C   The entirety of the information documented in the History of Present Illness, Review of Systems and Physical Exam were personally obtained by me. Portions of this information were initially documented by Rondel Baton, CMA and reviewed by me for thoroughness and accuracy.

## 2018-04-07 ENCOUNTER — Ambulatory Visit (INDEPENDENT_AMBULATORY_CARE_PROVIDER_SITE_OTHER): Payer: 59 | Admitting: Physician Assistant

## 2018-04-07 ENCOUNTER — Encounter: Payer: Self-pay | Admitting: Physician Assistant

## 2018-04-07 VITALS — BP 146/88 | HR 78 | Temp 98.8°F | Wt 158.9 lb

## 2018-04-07 DIAGNOSIS — I10 Essential (primary) hypertension: Secondary | ICD-10-CM

## 2018-04-07 DIAGNOSIS — Z23 Encounter for immunization: Secondary | ICD-10-CM

## 2018-04-07 DIAGNOSIS — E785 Hyperlipidemia, unspecified: Secondary | ICD-10-CM | POA: Diagnosis not present

## 2018-04-07 NOTE — Progress Notes (Signed)
Patient: Christopher BaileyMark Rosario Male    DOB: 04-12-1960   58 y.o.   MRN: [id number] Visit Date: 04/10/2018  Today's Provider: Trey SailorsAdriana M Pollak, PA-C   Chief Complaint  Patient presents with  . Hypertension   Subjective:    HPI  Hypertension, follow-up:  BP Readings from Last 3 Encounters:  04/07/18 (!) 146/88  02/24/18 (!) 150/90  02/10/18 (!) 180/110    He was last seen for hypertension  6 weeks ago.  BP at that visit was 150/90. He says his blood pressure readings at home are 130's/80s. Management changes since that visit include: increase amlodipine to 10 mg QD. He reports good compliance with treatment. He is not having side effects.  He is not exercising. He is not adherent to low salt diet.   Outside blood pressures are n/a. He is experiencing none.  Patient denies chest pain, chest pressure/discomfort, dyspnea, exertional chest pressure/discomfort, fatigue, irregular heart beat, palpitations and tachypnea.   Cardiovascular risk factors include hypertension and smoking/ tobacco exposure.  Use of agents associated with hypertension: none.     Weight trend: stable Wt Readings from Last 3 Encounters:  04/07/18 158 lb 14.4 oz (72.1 kg)  02/24/18 160 lb (72.6 kg)  02/10/18 155 lb 9.6 oz (70.6 kg)    Current diet: in general, a "healthy" diet    ------------------------------------------------------------------------   No Known Allergies   Current Outpatient Medications:  .  amLODipine (NORVASC) 10 MG tablet, Take 1 tablet (10 mg total) by mouth daily., Disp: 90 tablet, Rfl: 0 .  varenicline (CHANTIX STARTING MONTH PAK) 0.5 MG X 11 & 1 MG X 42 tablet, One 0.5 mg tablet by mouth 1x for 3 days, then increase to one 0.5 mg tablet twice daily for 4 days, then increase to one 1 mg tab 2x daily., Disp: 53 tablet, Rfl: 0 .  atorvastatin (LIPITOR) 20 MG tablet, Take 1 tablet (20 mg total) by mouth at bedtime., Disp: 90 tablet, Rfl: 3  Review of Systems  Constitutional:  Negative.   HENT: Negative.   Respiratory: Negative.   Neurological: Negative.     Social History   Tobacco Use  . Smoking status: Current Every Day Smoker    Packs/day: 1.00    Years: 35.00    Pack years: 35.00    Types: Cigarettes  . Smokeless tobacco: Never Used  Substance Use Topics  . Alcohol use: Yes    Alcohol/week: 7.0 standard drinks    Types: 7 Cans of beer per week      Objective:   BP (!) 146/88 (BP Location: Left Arm, Patient Position: Sitting, Cuff Size: Normal)   Pulse 78   Temp 98.8 F (37.1 C) (Oral)   Wt 158 lb 14.4 oz (72.1 kg)   BMI 23.47 kg/m  Vitals:   04/07/18 1357  BP: (!) 146/88  Pulse: 78  Temp: 98.8 F (37.1 C)  TempSrc: Oral  Weight: 158 lb 14.4 oz (72.1 kg)     Physical Exam Constitutional:      Appearance: Normal appearance.  Cardiovascular:     Rate and Rhythm: Normal rate and regular rhythm.     Heart sounds: Normal heart sounds.  Pulmonary:     Effort: Pulmonary effort is normal.     Breath sounds: Normal breath sounds.  Skin:    General: Skin is warm and dry.  Neurological:     Mental Status: He is alert and oriented to person, place, and time. Mental status  is at baseline.  Psychiatric:        Mood and Affect: Mood normal.        Behavior: Behavior normal.         Assessment & Plan    1. Hypertension, unspecified type  Improved overall though still slightly elevated today after increase to 10 mg amlodipine. Patient reports home readings are normal. Will recheck at next visit and consider adding medication if BP still elevated.  2. Hyperlipidemia, unspecified hyperlipidemia type  LDL above goal, will increase Lipitor to 20 mg nightly.   - Lipid Profile  3. Need for Tdap vaccination  - Tdap vaccine greater than or equal to 7yo IM  Return in about 6 months (around 10/06/2018) for CPE and follow up.  The entirety of the information documented in the History of Present Illness, Review of Systems and Physical  Exam were personally obtained by me. Portions of this information were initially documented by Rondel BatonSulibeya Dimas, CMA and reviewed by me for thoroughness and accuracy.       Trey SailorsAdriana M Pollak, PA-C  [facility]

## 2018-04-08 LAB — LIPID PANEL
Chol/HDL Ratio: 3.3 ratio (ref 0.0–5.0)
Cholesterol, Total: 195 mg/dL (ref 100–199)
HDL: 59 mg/dL (ref >39)
LDL Calculated: 105 mg/dL — ABNORMAL HIGH (ref 0–99)
Triglycerides: 156 mg/dL — ABNORMAL HIGH (ref 0–149)
VLDL Cholesterol Cal: 31 mg/dL (ref 5–40)

## 2018-04-09 ENCOUNTER — Telehealth: Payer: Self-pay

## 2018-04-09 DIAGNOSIS — E785 Hyperlipidemia, unspecified: Secondary | ICD-10-CM

## 2018-04-09 MED ORDER — ATORVASTATIN CALCIUM 20 MG PO TABS: 20.0000 mg | ORAL_TABLET | Freq: Every day | ORAL | 3 refills | Status: DC

## 2018-04-09 NOTE — Telephone Encounter (Signed)
Patient has been advised and prescription has been sent to [facility], per patients request he would like 90day Rx instead of 30. KW

## 2018-04-09 NOTE — Telephone Encounter (Signed)
-----   Message from Trey Sailors, [place] sent at 04/08/2018  3:59 PM EST ----- Cholesterol greatly improved but still slightly above goal. Recommend increasing to lipitor 20 mg, can take two of his current pills daily. Please send in lipitor 20 mg qhs.

## 2018-04-10 NOTE — Patient Instructions (Signed)

## 2018-05-03 ENCOUNTER — Other Ambulatory Visit: Payer: Self-pay | Admitting: Physician Assistant

## 2018-05-03 DIAGNOSIS — I10 Essential (primary) hypertension: Secondary | ICD-10-CM

## 2018-05-23 ENCOUNTER — Other Ambulatory Visit: Payer: Self-pay | Admitting: Physician Assistant

## 2018-05-23 DIAGNOSIS — I10 Essential (primary) hypertension: Secondary | ICD-10-CM

## 2018-08-20 ENCOUNTER — Other Ambulatory Visit: Payer: Self-pay | Admitting: Physician Assistant

## 2018-08-20 DIAGNOSIS — I10 Essential (primary) hypertension: Secondary | ICD-10-CM

## 2018-10-07 ENCOUNTER — Ambulatory Visit: Payer: Self-pay | Admitting: Physician Assistant

## 2018-11-24 ENCOUNTER — Other Ambulatory Visit: Payer: Self-pay | Admitting: Physician Assistant

## 2018-11-24 DIAGNOSIS — I10 Essential (primary) hypertension: Secondary | ICD-10-CM

## 2018-11-24 NOTE — Telephone Encounter (Signed)
Needs follow up, if he has BP cuff can be virtual. Filled x 90 days.

## 2018-11-25 NOTE — Telephone Encounter (Signed)
Appointment scheduled.

## 2018-12-17 ENCOUNTER — Encounter: Payer: Self-pay | Admitting: Physician Assistant

## 2018-12-17 ENCOUNTER — Ambulatory Visit (INDEPENDENT_AMBULATORY_CARE_PROVIDER_SITE_OTHER): Payer: Self-pay | Admitting: Physician Assistant

## 2018-12-17 VITALS — BP 124/78 | Ht 69.0 in | Wt 159.0 lb

## 2018-12-17 DIAGNOSIS — Z720 Tobacco use: Secondary | ICD-10-CM

## 2018-12-17 DIAGNOSIS — E785 Hyperlipidemia, unspecified: Secondary | ICD-10-CM

## 2018-12-17 DIAGNOSIS — I10 Essential (primary) hypertension: Secondary | ICD-10-CM

## 2018-12-17 NOTE — Progress Notes (Signed)
Patient: Christopher Rosario Male    DOB: 07/16/1960   58 y.o.   MRN: [id number] Visit Date: 12/17/2018  Today's Provider: Trinna Post, PA-C   Chief Complaint  Patient presents with  . Hypertension   Subjective:    Virtual Visit via Telephone Note  I connected with Tilman Neat on 12/17/18 at  1:40 PM EDT by telephone and verified that I am speaking with the correct person using two identifiers.   I discussed the limitations, risks, security and privacy concerns of performing an evaluation and management service by telephone and the availability of in person appointments. I also discussed with the patient that there may be a patient responsible charge related to this service. The patient expressed understanding and agreed to proceed.  Patient location: home Provider location: [place] office  Persons involved in the visit: patient, provider    HPI  Hypertension, follow-up:  BP Readings from Last 3 Encounters:  12/17/18 124/78  04/07/18 (!) 146/88  02/24/18 (!) 150/90    He was last seen for hypertension 8 months ago.  BP at that visit was 146/88. Management changes since that visit include no changes. Continues with amlodipine 10 mg daily, no issues.  He reports excellent compliance with treatment. He is not having side effects.  He is exercising. He is adherent to low salt diet.   Outside blood pressures are stable. He is experiencing none.  Patient denies chest pain and lower extremity edema.   Cardiovascular risk factors include hypertension and smoking/ tobacco exposure.  Use of agents associated with hypertension: none.     Weight trend: stable Wt Readings from Last 3 Encounters:  12/17/18 159 lb (72.1 kg)  04/07/18 158 lb 14.4 oz (72.1 kg)  02/24/18 160 lb (72.6 kg)    Current diet: in general, a "healthy" diet    Tobacco Abuse: Smoking a handful some days and was smoking one pack per day. Previously 1.5 packs per day.  HLD:  Currently taking Lipitor 20 mg daily without issue.   Lab Results  Component Value Date   CHOL 195 04/07/2018   CHOL 262 (H) 02/10/2018   Lab Results  Component Value Date   HDL 59 04/07/2018   HDL 59 02/10/2018   Lab Results  Component Value Date   LDLCALC 105 (H) 04/07/2018   LDLCALC 165 (H) 02/10/2018   Lab Results  Component Value Date   TRIG 156 (H) 04/07/2018   TRIG 191 (H) 02/10/2018   Lab Results  Component Value Date   CHOLHDL 3.3 04/07/2018   CHOLHDL 4.4 02/10/2018   No results found for: LDLDIRECT   ------------------------------------------------------------------------   No Known Allergies   Current Outpatient Medications:  .  amLODipine (NORVASC) 10 MG tablet, TAKE 1 TABLET BY MOUTH EVERY DAY, Disp: 90 tablet, Rfl: 0 .  atorvastatin (LIPITOR) 20 MG tablet, Take 1 tablet (20 mg total) by mouth at bedtime., Disp: 90 tablet, Rfl: 3  Review of Systems  Constitutional: Negative.   Respiratory: Negative.   Cardiovascular: Negative.     Social History   Tobacco Use  . Smoking status: Current Every Day Smoker    Packs/day: 1.00    Years: 35.00    Pack years: 35.00    Types: Cigarettes  . Smokeless tobacco: Never Used  Substance Use Topics  . Alcohol use: Yes    Alcohol/week: 7.0 standard drinks    Types: 7 Cans of beer per week  Objective:   BP 124/78 (BP Location: Left Arm, Patient Position: Sitting, Cuff Size: Normal)   Ht 5\' 9"  (1.753 m)   Wt 159 lb (72.1 kg)   BMI 23.48 kg/m  Vitals:   12/17/18 1324  BP: 124/78  Weight: 159 lb (72.1 kg)  Height: 5\' 9"  (1.753 m)  Body mass index is 23.48 kg/m.   Physical Exam   No results found for any visits on 12/17/18.     Assessment & Plan    1. Hypertension, unspecified type  Improved, continue amlodipine 10 mg daily. Follow up 1 year for CPE and f/u.  2. Hyperlipidemia, unspecified hyperlipidemia type  Continue Lipitor 20 mg daily. Labs at next OV.   3. Tobacco  abuse  Still smoking though has reduced intake. Counseled on quitting.   The entirety of the information documented in the History of Present Illness, Review of Systems and Physical Exam were personally obtained by me. Portions of this information were initially documented by Rondel BatonSulibeya Dimas, CMA and reviewed by me for thoroughness and accuracy.         Trey SailorsAdriana M Kristeen Lantz, PA-C  [facility]

## 2018-12-17 NOTE — Patient Instructions (Signed)

## 2019-02-11 ENCOUNTER — Telehealth: Payer: Self-pay | Admitting: Physician Assistant

## 2019-02-11 NOTE — Telephone Encounter (Signed)
Pt has a swollen jaw from a broken tooth that has turned abscess.  He doesn't have a reg dentist.  Wondering what Fabio Bering would suggest or prescribe to help.  Please advise.  Thanks, [employer]

## 2019-02-11 NOTE — Telephone Encounter (Signed)
Tried calling patient to schedule virtual appointment no answer and vm was full.

## 2019-02-11 NOTE — Telephone Encounter (Signed)
Yes OV or virtual video visit please. Thanks. Will ultimately need a dentist.

## 2019-02-12 NOTE — Telephone Encounter (Signed)
Patient states that he is been seen by a dentist today 02/12/2019 and will not need appointment. FYI

## 2019-03-19 ENCOUNTER — Other Ambulatory Visit: Payer: Self-pay | Admitting: Physician Assistant

## 2019-03-19 DIAGNOSIS — I10 Essential (primary) hypertension: Secondary | ICD-10-CM

## 2019-04-26 ENCOUNTER — Other Ambulatory Visit: Payer: Self-pay | Admitting: Physician Assistant

## 2019-04-26 DIAGNOSIS — E785 Hyperlipidemia, unspecified: Secondary | ICD-10-CM

## 2019-07-12 ENCOUNTER — Other Ambulatory Visit: Payer: Self-pay | Admitting: Physician Assistant

## 2019-07-12 DIAGNOSIS — I10 Essential (primary) hypertension: Secondary | ICD-10-CM

## 2019-07-12 NOTE — Telephone Encounter (Signed)
Requested Prescriptions  Pending Prescriptions Disp Refills  . amLODipine (NORVASC) 10 MG tablet [Pharmacy Med Name: AMLODIPINE BESYLATE 10 MG TAB] 30 tablet 0    Sig: TAKE 1 TABLET BY MOUTH EVERY DAY     Cardiovascular:  Calcium Channel Blockers Failed - 07/12/2019 12:56 AM      Failed - Valid encounter within last 6 months    Recent Outpatient Visits          6 months ago Hypertension, unspecified type   [facility] [place], [place], PA-C   1 year ago Hypertension, unspecified type   [facility] [place], [place], [place]   1 year ago Hypertension, unspecified type   [facility] [place], [place], [place]   1 year ago Neck pain   [facility] [place], [place]   2 years ago Acute non-recurrent frontal sinusitis   [facility] [place], [place]             Passed - Last BP in normal range    BP Readings from Last 1 Encounters:  12/17/18 124/78

## 2019-08-12 ENCOUNTER — Other Ambulatory Visit: Payer: Self-pay | Admitting: Physician Assistant

## 2019-08-12 DIAGNOSIS — I10 Essential (primary) hypertension: Secondary | ICD-10-CM

## 2019-08-12 NOTE — Telephone Encounter (Signed)
Requested medication (s) are due for refill today: yes  Requested medication (s) are on the active medication list: yes  Last refill: 07/12/2019  Future visit scheduled: no  Notes to clinic:  Patient is aware that he needs a appointment and was already given a 30 day courtesy  Requested Prescriptions  Pending Prescriptions Disp Refills   amLODipine (NORVASC) 10 MG tablet [Pharmacy Med Name: AMLODIPINE BESYLATE 10 MG TAB] 30 tablet 0    Sig: TAKE 1 TABLET BY MOUTH EVERY DAY      Cardiovascular:  Calcium Channel Blockers Failed - 08/12/2019 11:31 AM      Failed - Valid encounter within last 6 months    Recent Outpatient Visits           7 months ago Hypertension, unspecified type   [facility] [place], [place], PA-C   1 year ago Hypertension, unspecified type   [facility] [place], [place], [place]   1 year ago Hypertension, unspecified type   [facility] [place], [place], [place]   1 year ago Neck pain   [facility] [place] Angst M, [place]   2 years ago Acute non-recurrent frontal sinusitis   [facility] [place] Angst M, [place]              Passed - Last BP in normal range    BP Readings from Last 1 Encounters:  12/17/18 124/78

## 2019-09-13 ENCOUNTER — Other Ambulatory Visit: Payer: Self-pay | Admitting: Physician Assistant

## 2019-09-13 DIAGNOSIS — I10 Essential (primary) hypertension: Secondary | ICD-10-CM

## 2019-09-27 ENCOUNTER — Other Ambulatory Visit: Payer: Self-pay | Admitting: Physician Assistant

## 2019-09-27 DIAGNOSIS — I10 Essential (primary) hypertension: Secondary | ICD-10-CM

## 2019-09-27 NOTE — Telephone Encounter (Signed)
Requested Prescriptions  Pending Prescriptions Disp Refills  . amLODipine (NORVASC) 10 MG tablet [Pharmacy Med Name: AMLODIPINE BESYLATE 10 MG TAB] 30 tablet 0    Sig: TAKE 1 TABLET BY MOUTH EVERY DAY     Cardiovascular:  Calcium Channel Blockers Failed - 09/27/2019 12:07 PM      Failed - Valid encounter within last 6 months    Recent Outpatient Visits          9 months ago Hypertension, unspecified type   [facility] [place], [place], PA-C   1 year ago Hypertension, unspecified type   [facility] [place], [place], [place]   1 year ago Hypertension, unspecified type   [facility], [place], [place]   1 year ago Neck pain   [facility] [place] Angst M, [place]   2 years ago Acute non-recurrent frontal sinusitis   [facility] Os[place]do Angst M, [place]             Passed - Last BP in normal range    BP Readings from Last 1 Encounters:  12/17/18 124/78

## 2019-11-22 ENCOUNTER — Other Ambulatory Visit: Payer: Self-pay | Admitting: Physician Assistant

## 2019-11-22 DIAGNOSIS — I10 Essential (primary) hypertension: Secondary | ICD-10-CM

## 2019-11-22 NOTE — Telephone Encounter (Signed)
Requested medications are due for refill today?  Yes  Requested medications are on active medication list? Yes  Last Refill:   09/27/2019  # 30 with no refills.  This was a courtesy refill with reminder for patient to call and schedule an appointment for an office visit for future refills.    Future visit scheduled? No  Notes to Clinic:  Medication failed RX refill protocol due to no valid encounter in the past 6 months.  Last visit was 11 months ago.

## 2019-12-15 ENCOUNTER — Other Ambulatory Visit: Payer: Self-pay | Admitting: Physician Assistant

## 2019-12-15 DIAGNOSIS — I10 Essential (primary) hypertension: Secondary | ICD-10-CM

## 2019-12-15 NOTE — Telephone Encounter (Signed)
Pt requesting amlodipine RF. Last RF was a courtesy RF dated 09/27/19 #30  Attempted to call pt to make an appt and mailbox is full and not able to leave message. Routing to office

## 2022-03-08 ENCOUNTER — Inpatient Hospital Stay (HOSPITAL_COMMUNITY)
Admit: 2022-03-08 | Discharge: 2022-03-08 | Disposition: A | Discharge status: Home / Self Care | Payer: BC Managed Care – PPO | Attending: Cardiology | Admitting: Cardiology

## 2022-03-08 ENCOUNTER — Inpatient Hospital Stay
Admission: EM | Admit: 2022-03-08 | Discharge: 2022-03-10 | DRG: 321 | Disposition: A | Discharge status: Home / Self Care | Payer: BC Managed Care – PPO | Attending: Internal Medicine | Admitting: Internal Medicine

## 2022-03-08 ENCOUNTER — Encounter
Admission: EM | Disposition: A | Discharge status: Home / Self Care | Payer: Self-pay | Source: Home / Self Care | Attending: Internal Medicine

## 2022-03-08 DIAGNOSIS — I959 Hypotension, unspecified: Secondary | ICD-10-CM | POA: Diagnosis not present

## 2022-03-08 DIAGNOSIS — Z823 Family history of stroke: Secondary | ICD-10-CM | POA: Diagnosis not present

## 2022-03-08 DIAGNOSIS — Z79899 Other long term (current) drug therapy: Secondary | ICD-10-CM | POA: Diagnosis not present

## 2022-03-08 DIAGNOSIS — E785 Hyperlipidemia, unspecified: Secondary | ICD-10-CM | POA: Diagnosis not present

## 2022-03-08 DIAGNOSIS — I2111 ST elevation (STEMI) myocardial infarction involving right coronary artery: Principal | ICD-10-CM

## 2022-03-08 DIAGNOSIS — F17200 Nicotine dependence, unspecified, uncomplicated: Secondary | ICD-10-CM | POA: Diagnosis present

## 2022-03-08 DIAGNOSIS — I493 Ventricular premature depolarization: Secondary | ICD-10-CM | POA: Diagnosis not present

## 2022-03-08 DIAGNOSIS — I2119 ST elevation (STEMI) myocardial infarction involving other coronary artery of inferior wall: Secondary | ICD-10-CM

## 2022-03-08 DIAGNOSIS — I2510 Atherosclerotic heart disease of native coronary artery without angina pectoris: Secondary | ICD-10-CM | POA: Diagnosis not present

## 2022-03-08 DIAGNOSIS — F1020 Alcohol dependence, uncomplicated: Secondary | ICD-10-CM | POA: Diagnosis present

## 2022-03-08 DIAGNOSIS — I4901 Ventricular fibrillation: Secondary | ICD-10-CM | POA: Diagnosis not present

## 2022-03-08 DIAGNOSIS — Z8042 Family history of malignant neoplasm of prostate: Secondary | ICD-10-CM

## 2022-03-08 DIAGNOSIS — E876 Hypokalemia: Secondary | ICD-10-CM | POA: Diagnosis not present

## 2022-03-08 DIAGNOSIS — I10 Essential (primary) hypertension: Secondary | ICD-10-CM | POA: Diagnosis present

## 2022-03-08 DIAGNOSIS — I462 Cardiac arrest due to underlying cardiac condition: Secondary | ICD-10-CM | POA: Diagnosis not present

## 2022-03-08 DIAGNOSIS — I469 Cardiac arrest, cause unspecified: Secondary | ICD-10-CM

## 2022-03-08 DIAGNOSIS — I472 Ventricular tachycardia, unspecified: Secondary | ICD-10-CM | POA: Diagnosis present

## 2022-03-08 DIAGNOSIS — Z803 Family history of malignant neoplasm of breast: Secondary | ICD-10-CM | POA: Diagnosis not present

## 2022-03-08 DIAGNOSIS — F17210 Nicotine dependence, cigarettes, uncomplicated: Secondary | ICD-10-CM | POA: Diagnosis present

## 2022-03-08 DIAGNOSIS — Z8249 Family history of ischemic heart disease and other diseases of the circulatory system: Secondary | ICD-10-CM | POA: Diagnosis not present

## 2022-03-08 DIAGNOSIS — I213 ST elevation (STEMI) myocardial infarction of unspecified site: Secondary | ICD-10-CM | POA: Diagnosis not present

## 2022-03-08 HISTORY — DX: Cardiac arrest, cause unspecified: I46.9

## 2022-03-08 HISTORY — DX: Hyperlipidemia, unspecified: E78.5

## 2022-03-08 HISTORY — PX: CORONARY/GRAFT ACUTE MI REVASCULARIZATION: CATH118305

## 2022-03-08 HISTORY — PX: LEFT HEART CATH AND CORONARY ANGIOGRAPHY: CATH118249

## 2022-03-08 HISTORY — DX: ST elevation (STEMI) myocardial infarction involving other coronary artery of inferior wall: I21.19

## 2022-03-08 LAB — CBC WITH DIFFERENTIAL/PLATELET
Abs Immature Granulocytes: 0.08 10*3/uL — ABNORMAL HIGH (ref 0.00–0.07)
Basophils Absolute: 0.1 10*3/uL (ref 0.0–0.1)
Basophils Relative: 0 %
Eosinophils Absolute: 0.2 10*3/uL (ref 0.0–0.5)
Eosinophils Relative: 2 %
HCT: 46.2 % (ref 39.0–52.0)
Hemoglobin: 15.4 g/dL (ref 13.0–17.0)
Immature Granulocytes: 1 %
Lymphocytes Relative: 20 %
Lymphs Abs: 2.5 10*3/uL (ref 0.7–4.0)
MCH: 33 pg (ref 26.0–34.0)
MCHC: 33.3 g/dL (ref 30.0–36.0)
MCV: 99.1 fL (ref 80.0–100.0)
Monocytes Absolute: 0.8 10*3/uL (ref 0.1–1.0)
Monocytes Relative: 7 %
Neutro Abs: 8.6 10*3/uL — ABNORMAL HIGH (ref 1.7–7.7)
Neutrophils Relative %: 70 %
Platelets: 225 10*3/uL (ref 150–400)
RBC: 4.66 MIL/uL (ref 4.22–5.81)
RDW: 11.6 % (ref 11.5–15.5)
WBC: 12.2 10*3/uL — ABNORMAL HIGH (ref 4.0–10.5)
nRBC: 0 % (ref 0.0–0.2)

## 2022-03-08 LAB — ECHOCARDIOGRAM COMPLETE
AV Mean grad: 2 mmHg
AV Peak grad: 3.6 mmHg
Ao pk vel: 0.95 m/s
Area-P 1/2: 4.83 cm2
Height: 69 in
S' Lateral: 3.1 cm
Weight: 2158.74 oz

## 2022-03-08 LAB — POCT I-STAT EG7
Acid-base deficit: 9 mmol/L — ABNORMAL HIGH (ref 0.0–2.0)
Bicarbonate: 14.5 mmol/L — ABNORMAL LOW (ref 20.0–28.0)
Calcium, Ion: 0.98 mmol/L — ABNORMAL LOW (ref 1.15–1.40)
HCT: 41 % (ref 39.0–52.0)
Hemoglobin: 13.9 g/dL (ref 13.0–17.0)
O2 Saturation: 99 %
Potassium: 3 mmol/L — ABNORMAL LOW (ref 3.5–5.1)
Sodium: 126 mmol/L — ABNORMAL LOW (ref 135–145)
TCO2: 15 mmol/L — ABNORMAL LOW (ref 22–32)
pCO2, Ven: 25.6 mmHg — ABNORMAL LOW (ref 44–60)
pH, Ven: 7.362 (ref 7.25–7.43)
pO2, Ven: 133 mmHg — ABNORMAL HIGH (ref 32–45)

## 2022-03-08 LAB — COMPREHENSIVE METABOLIC PANEL
ALT: 20 U/L (ref 0–44)
AST: 33 U/L (ref 15–41)
Albumin: 3.7 g/dL (ref 3.5–5.0)
Alkaline Phosphatase: 74 U/L (ref 38–126)
Anion gap: 7 (ref 5–15)
BUN: 9 mg/dL (ref 8–23)
CO2: 23 mmol/L (ref 22–32)
Calcium: 8.3 mg/dL — ABNORMAL LOW (ref 8.9–10.3)
Chloride: 110 mmol/L (ref 98–111)
Creatinine, Ser: 0.79 mg/dL (ref 0.61–1.24)
GFR, Estimated: >60 mL/min (ref >60)
Glucose, Bld: 123 mg/dL — ABNORMAL HIGH (ref 70–99)
Potassium: 3.3 mmol/L — ABNORMAL LOW (ref 3.5–5.1)
Sodium: 140 mmol/L (ref 135–145)
Total Bilirubin: 0.5 mg/dL (ref 0.3–1.2)
Total Protein: 6.8 g/dL (ref 6.5–8.1)

## 2022-03-08 LAB — LIPID PANEL
Cholesterol: 211 mg/dL — ABNORMAL HIGH (ref 0–200)
HDL: 47 mg/dL (ref >40)
LDL Cholesterol: 130 mg/dL — ABNORMAL HIGH (ref 0–99)
Total CHOL/HDL Ratio: 4.5 RATIO
Triglycerides: 172 mg/dL — ABNORMAL HIGH (ref <150)
VLDL: 34 mg/dL (ref 0–40)

## 2022-03-08 LAB — TROPONIN I (HIGH SENSITIVITY)
Troponin I (High Sensitivity): 72 ng/L — ABNORMAL HIGH (ref <18)
Troponin I (High Sensitivity): 2408 ng/L (ref <18)

## 2022-03-08 LAB — MRSA NEXT GEN BY PCR, NASAL: MRSA by PCR Next Gen: NOT DETECTED

## 2022-03-08 LAB — HEMOGLOBIN A1C
Hgb A1c MFr Bld: 5.3 % (ref 4.8–5.6)
Mean Plasma Glucose: 105 mg/dL

## 2022-03-08 LAB — APTT: aPTT: 26 seconds (ref 24–36)

## 2022-03-08 LAB — GLUCOSE, CAPILLARY
Glucose-Capillary: 102 mg/dL — ABNORMAL HIGH (ref 70–99)
Glucose-Capillary: 142 mg/dL — ABNORMAL HIGH (ref 70–99)

## 2022-03-08 LAB — MAGNESIUM: Magnesium: 2.2 mg/dL (ref 1.7–2.4)

## 2022-03-08 LAB — POCT ACTIVATED CLOTTING TIME: Activated Clotting Time: 288 seconds

## 2022-03-08 LAB — PROTIME-INR
INR: 1 (ref 0.8–1.2)
Prothrombin Time: 13.3 seconds (ref 11.4–15.2)

## 2022-03-08 SURGERY — CORONARY/GRAFT ACUTE MI REVASCULARIZATION
Anesthesia: Moderate Sedation

## 2022-03-08 MED ORDER — ASPIRIN 81 MG PO CHEW
81.0000 mg | CHEWABLE_TABLET | Freq: Every day | ORAL | Status: DC
  Administered 2022-03-09 – 2022-03-10 (×2): 81 mg via ORAL
  Filled 2022-03-08 (×2): qty 1

## 2022-03-08 MED ORDER — TIROFIBAN HCL IN NACL 5-0.9 MG/100ML-% IV SOLN
INTRAVENOUS | Status: DC | PRN
  Administered 2022-03-08: .15 ug/kg/min via INTRAVENOUS

## 2022-03-08 MED ORDER — LORAZEPAM 1 MG PO TABS
0.0000 mg | ORAL_TABLET | Freq: Four times a day (QID) | ORAL | Status: DC
  Administered 2022-03-09: 1 mg via ORAL
  Filled 2022-03-08: qty 1

## 2022-03-08 MED ORDER — TICAGRELOR 90 MG PO TABS
ORAL_TABLET | ORAL | Status: AC
  Filled 2022-03-08: qty 2

## 2022-03-08 MED ORDER — METHYLPREDNISOLONE SODIUM SUCC 125 MG IJ SOLR
INTRAMUSCULAR | Status: DC | PRN
  Administered 2022-03-08: 125 mg via INTRAVENOUS

## 2022-03-08 MED ORDER — ONDANSETRON HCL 4 MG/2ML IJ SOLN: 4.0000 mg | Freq: Four times a day (QID) | INTRAMUSCULAR | Status: DC | PRN

## 2022-03-08 MED ORDER — NOREPINEPHRINE 4 MG/250ML-% IV SOLN
INTRAVENOUS | Status: DC | PRN
  Administered 2022-03-08: 5 ug/kg/min via INTRAVENOUS

## 2022-03-08 MED ORDER — SODIUM CHLORIDE 0.9 % IV SOLN: 250.0000 mL | INTRAVENOUS | Status: DC | PRN

## 2022-03-08 MED ORDER — HEPARIN (PORCINE) IN NACL 1000-0.9 UT/500ML-% IV SOLN
INTRAVENOUS | Status: DC | PRN
  Administered 2022-03-08 (×2): 500 mL

## 2022-03-08 MED ORDER — SODIUM CHLORIDE 0.9 % IV SOLN
INTRAVENOUS | Status: AC | PRN
  Administered 2022-03-08: 250 mL via INTRAVENOUS

## 2022-03-08 MED ORDER — THIAMINE HCL 100 MG/ML IJ SOLN: 100.0000 mg | Freq: Every day | INTRAMUSCULAR | Status: DC

## 2022-03-08 MED ORDER — FOLIC ACID 1 MG PO TABS
1.0000 mg | ORAL_TABLET | Freq: Every day | ORAL | Status: DC
  Administered 2022-03-08 – 2022-03-10 (×3): 1 mg via ORAL
  Filled 2022-03-08 (×3): qty 1

## 2022-03-08 MED ORDER — SODIUM CHLORIDE 0.9% FLUSH
3.0000 mL | Freq: Two times a day (BID) | INTRAVENOUS | Status: DC
  Administered 2022-03-09 – 2022-03-10 (×2): 3 mL via INTRAVENOUS

## 2022-03-08 MED ORDER — LORAZEPAM 1 MG PO TABS: 1.0000 mg | ORAL_TABLET | ORAL | Status: DC | PRN

## 2022-03-08 MED ORDER — HYDRALAZINE HCL 20 MG/ML IJ SOLN: 10.0000 mg | INTRAMUSCULAR | Status: AC | PRN

## 2022-03-08 MED ORDER — CHLORHEXIDINE GLUCONATE CLOTH 2 % EX PADS
6.0000 | MEDICATED_PAD | Freq: Every day | CUTANEOUS | Status: DC
  Administered 2022-03-08 – 2022-03-10 (×2): 6 via TOPICAL

## 2022-03-08 MED ORDER — METOPROLOL TARTRATE 25 MG PO TABS
12.5000 mg | ORAL_TABLET | Freq: Two times a day (BID) | ORAL | Status: DC
  Administered 2022-03-08 – 2022-03-10 (×5): 12.5 mg via ORAL
  Filled 2022-03-08 (×5): qty 1

## 2022-03-08 MED ORDER — MIDAZOLAM HCL 2 MG/2ML IJ SOLN
INTRAMUSCULAR | Status: DC | PRN
  Administered 2022-03-08: 1 mg via INTRAVENOUS

## 2022-03-08 MED ORDER — THIAMINE MONONITRATE 100 MG PO TABS
100.0000 mg | ORAL_TABLET | Freq: Every day | ORAL | Status: DC
  Administered 2022-03-08 – 2022-03-10 (×3): 100 mg via ORAL
  Filled 2022-03-08 (×3): qty 1

## 2022-03-08 MED ORDER — METHYLPREDNISOLONE SODIUM SUCC 125 MG IJ SOLR
INTRAMUSCULAR | Status: AC
  Filled 2022-03-08: qty 2

## 2022-03-08 MED ORDER — SODIUM CHLORIDE 0.9% FLUSH: 3.0000 mL | INTRAVENOUS | Status: DC | PRN

## 2022-03-08 MED ORDER — ATORVASTATIN CALCIUM 80 MG PO TABS
80.0000 mg | ORAL_TABLET | Freq: Every evening | ORAL | Status: DC
  Administered 2022-03-08 – 2022-03-09 (×2): 80 mg via ORAL
  Filled 2022-03-08 (×2): qty 4

## 2022-03-08 MED ORDER — VERAPAMIL HCL 2.5 MG/ML IV SOLN
INTRAVENOUS | Status: AC
  Filled 2022-03-08: qty 2

## 2022-03-08 MED ORDER — LABETALOL HCL 5 MG/ML IV SOLN: 10.0000 mg | INTRAVENOUS | Status: AC | PRN

## 2022-03-08 MED ORDER — MIDAZOLAM HCL 2 MG/2ML IJ SOLN
INTRAMUSCULAR | Status: AC
  Filled 2022-03-08: qty 2

## 2022-03-08 MED ORDER — HEPARIN SODIUM (PORCINE) 5000 UNIT/ML IJ SOLN
5000.0000 [IU] | Freq: Three times a day (TID) | INTRAMUSCULAR | Status: DC
  Administered 2022-03-08 – 2022-03-10 (×4): 5000 [IU] via SUBCUTANEOUS
  Filled 2022-03-08 (×4): qty 1

## 2022-03-08 MED ORDER — TICAGRELOR 90 MG PO TABS
90.0000 mg | ORAL_TABLET | Freq: Two times a day (BID) | ORAL | Status: DC
  Administered 2022-03-08 – 2022-03-10 (×4): 90 mg via ORAL
  Filled 2022-03-08 (×4): qty 1

## 2022-03-08 MED ORDER — FENTANYL CITRATE (PF) 100 MCG/2ML IJ SOLN
INTRAMUSCULAR | Status: AC
  Filled 2022-03-08: qty 2

## 2022-03-08 MED ORDER — HEPARIN SODIUM (PORCINE) 1000 UNIT/ML IJ SOLN
INTRAMUSCULAR | Status: AC
  Filled 2022-03-08: qty 10

## 2022-03-08 MED ORDER — LORAZEPAM 1 MG PO TABS: 0.0000 mg | ORAL_TABLET | Freq: Two times a day (BID) | ORAL | Status: DC

## 2022-03-08 MED ORDER — SODIUM CHLORIDE 0.9 % WEIGHT BASED INFUSION
1.0000 mL/kg/h | INTRAVENOUS | Status: AC
  Administered 2022-03-08: 1 mL/kg/h via INTRAVENOUS

## 2022-03-08 MED ORDER — MORPHINE SULFATE (PF) 2 MG/ML IV SOLN: 1.0000 mg | INTRAVENOUS | Status: DC | PRN

## 2022-03-08 MED ORDER — NICOTINE 21 MG/24HR TD PT24
21.0000 mg | MEDICATED_PATCH | Freq: Every day | TRANSDERMAL | Status: DC
  Administered 2022-03-08 – 2022-03-10 (×3): 21 mg via TRANSDERMAL
  Filled 2022-03-08 (×3): qty 1

## 2022-03-08 MED ORDER — VERAPAMIL HCL 2.5 MG/ML IV SOLN
INTRAVENOUS | Status: DC | PRN
  Administered 2022-03-08: 2.5 mg via INTRA_ARTERIAL

## 2022-03-08 MED ORDER — NITROGLYCERIN 1 MG/10 ML FOR IR/CATH LAB
INTRA_ARTERIAL | Status: AC
  Filled 2022-03-08: qty 10

## 2022-03-08 MED ORDER — TIROFIBAN HCL IV 12.5 MG/250 ML
INTRAVENOUS | Status: AC
  Filled 2022-03-08: qty 250

## 2022-03-08 MED ORDER — ADULT MULTIVITAMIN W/MINERALS CH
1.0000 | ORAL_TABLET | Freq: Every day | ORAL | Status: DC
  Administered 2022-03-08 – 2022-03-10 (×3): 1 via ORAL
  Filled 2022-03-08 (×3): qty 1

## 2022-03-08 MED ORDER — ATROPINE SULFATE 1 MG/10ML IJ SOSY
PREFILLED_SYRINGE | INTRAMUSCULAR | Status: AC
  Filled 2022-03-08: qty 10

## 2022-03-08 MED ORDER — POTASSIUM CHLORIDE CRYS ER 20 MEQ PO TBCR
40.0000 meq | EXTENDED_RELEASE_TABLET | Freq: Once | ORAL | Status: AC
  Administered 2022-03-08: 40 meq via ORAL
  Filled 2022-03-08: qty 2

## 2022-03-08 MED ORDER — HEPARIN SODIUM (PORCINE) 1000 UNIT/ML IJ SOLN
INTRAMUSCULAR | Status: DC | PRN
  Administered 2022-03-08: 7500 [IU] via INTRAVENOUS

## 2022-03-08 MED ORDER — LORAZEPAM 2 MG/ML IJ SOLN: 1.0000 mg | INTRAMUSCULAR | Status: DC | PRN

## 2022-03-08 MED ORDER — FENTANYL CITRATE (PF) 100 MCG/2ML IJ SOLN
INTRAMUSCULAR | Status: DC | PRN
  Administered 2022-03-08 (×2): 25 ug via INTRAVENOUS

## 2022-03-08 MED ORDER — ACETAMINOPHEN 325 MG PO TABS: 650.0000 mg | ORAL_TABLET | ORAL | Status: DC | PRN

## 2022-03-08 MED ORDER — TIROFIBAN (AGGRASTAT) BOLUS VIA INFUSION
INTRAVENOUS | Status: DC | PRN
  Administered 2022-03-08: 1815 ug via INTRAVENOUS

## 2022-03-08 MED ORDER — IOHEXOL 300 MG/ML  SOLN
INTRAMUSCULAR | Status: DC | PRN
  Administered 2022-03-08: 105 mL

## 2022-03-08 MED ORDER — NOREPINEPHRINE BITARTRATE 1 MG/ML IV SOLN
INTRAVENOUS | Status: AC
  Filled 2022-03-08: qty 4

## 2022-03-08 SURGICAL SUPPLY — 20 items
BALLN EUPHORA RX 2.0X12 (BALLOONS) ×1
BALLN ~~LOC~~ TREK NEO RX 3.0X20 (BALLOONS) ×1
BALLOON EUPHORA RX 2.0X12 (BALLOONS) IMPLANT
BALLOON ~~LOC~~ TREK NEO RX 3.0X20 (BALLOONS) IMPLANT
CATH 5F 110X4 TIG (CATHETERS) IMPLANT
CATH VISTA GUIDE 6FR MPA1 (CATHETERS) IMPLANT
DEVICE RAD TR BAND REGULAR (VASCULAR PRODUCTS) IMPLANT
DRAPE BRACHIAL (DRAPES) IMPLANT
GLIDESHEATH SLEND SS 6F .021 (SHEATH) IMPLANT
GUIDEWIRE INQWIRE 1.5J.035X260 (WIRE) IMPLANT
INQWIRE 1.5J .035X260CM (WIRE) ×1
KIT ENCORE 26 ADVANTAGE (KITS) IMPLANT
KIT SYRINGE INJ CVI SPIKEX1 (MISCELLANEOUS) IMPLANT
PACK CARDIAC CATH (CUSTOM PROCEDURE TRAY) ×1 IMPLANT
PROTECTION STATION PRESSURIZED (MISCELLANEOUS) ×1
SET ATX SIMPLICITY (MISCELLANEOUS) IMPLANT
STATION PROTECTION PRESSURIZED (MISCELLANEOUS) IMPLANT
STENT ONYX FRONTIER 2.5X26 (Permanent Stent) IMPLANT
TUBING CIL FLEX 10 FLL-RA (TUBING) IMPLANT
WIRE ASAHI PROWATER 180CM (WIRE) IMPLANT

## 2022-03-08 NOTE — Assessment & Plan Note (Addendum)
Continue atorvastatin.  LDL 142, goal less than 70

## 2022-03-08 NOTE — ED Triage Notes (Addendum)
Pt comes in via [facility] with complaints of left arm and jaw pain. Pt stated he was on his way to work, when he felt the pain. Pt encoded by [facility]  as a code STEMI.  En route pt received four baby aspirins. Pt then went into [facility], was shocked once at 360J,and had one round of cpr and regained consciousness.   Pt alert and oriented on arrival, with no complaints of n/v or jaw pain. Pt still experiencing left arm pain. PT has two 18 gauge iv's in the left arm. One in the forearm and one in the ac.  Vitals: BP 119/72, pulse 72, Spo2 95%, Respirations 20  Pt transported to cathlab 

## 2022-03-08 NOTE — Progress Notes (Signed)
*  PRELIMINARY RESULTS* Echocardiogram 2D Echocardiogram has been performed.  Christopher Rosario 03/08/2022, 12:36 PM

## 2022-03-08 NOTE — ED Notes (Signed)
Pt transported to cath with Dr. Herbie [place] on [address].

## 2022-03-08 NOTE — Assessment & Plan Note (Addendum)
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  Continue aspirin, Brilinta, metoprolol, atorvastatin.

## 2022-03-08 NOTE — Progress Notes (Signed)
Responded to Code Stemi. Patient was already taken to [facility] Lab when I arrived. No family present, asked to be paged if needed.

## 2022-03-08 NOTE — H&P (Signed)
History and Physical    Patient: Christopher Rosario GHW:[id number] DOB: 1960-07-07 DOA: 03/08/2022 DOS: the patient was seen and examined on 03/08/2022 PCP: Trey Sailors, PA-C (Inactive)  Patient coming from: Home  Chief Complaint:  Chief Complaint  Patient presents with   Code STEMI   HPI: Christopher Rosario is a 61 y.o. male with medical history significant for nicotine dependence, hypertension, dyslipidemia not on any medication, daily alcohol use who presents to the ER by EMS as a code STEMI. Patient states that he had left arm and jaw pain while at work a couple of days prior to presentation that resolved spontaneously. On the morning of his admission, he woke up and was on his way to work when he started having left arm pain and so decided to go back home and make an appointment to see a doctor later that day.  He states that the pain was persistent and he rated it a 9 x 10 in intensity at its worst, he started feeling flushed and then became extremely diaphoretic and so he called EMS.  When EMS arrived he was noted to have significant 5 to 6 mm inferior ST elevations consistent with an inferior STEMI and so a code STEMI was called.  On his way to the hospital he suffered a V-fib arrest that resolved after 1 shock and brief CPR with ROSC.  Patient states he developed chest pressure while in the ambulance associated with nausea and shortness of breath. He will taken emergently to the Cath Lab. He denies having any fever, no chills, no cough, no abdominal pain, no changes in bowel habits, no urinary symptoms, no dizziness, no lightheadedness, no leg swelling, no blurred vision, no focal deficit.   Review of Systems: As mentioned in the history of present illness. All other systems reviewed and are negative. No past medical history on file. Past Surgical History:  Procedure Laterality Date   NO PAST SURGERIES     Social History:  reports that he has been smoking cigarettes. He has a 35.00 pack-year  smoking history. He has never used smokeless tobacco. He reports current alcohol use of about 7.0 standard drinks of alcohol per week. He reports that he does not use drugs.  No Known Allergies  Family History  Problem Relation Age of Onset   Stroke Mother    Cancer Mother    Breast cancer Mother    Aneurysm Father    Hypertension Father    Healthy Sister    Prostate cancer Maternal Uncle     Prior to Admission medications   Not on File    Physical Exam: Vitals:   03/08/22 0913 03/08/22 0928 03/08/22 1040 03/08/22 1100  BP:   126/85 123/68  Pulse:    85  Resp:    11  Temp:   (!) 97.5 F (36.4 C)   TempSrc:   Oral   SpO2: 100%  95% 98%  Weight:  72.6 kg 61.2 kg   Height:   5\' 9"  (1.753 m)    Physical Exam Vitals and nursing note reviewed.  Constitutional:      Appearance: Normal appearance.  HENT:     Head: Normocephalic and atraumatic.     Nose: Nose normal.     Mouth/Throat:     Mouth: Mucous membranes are moist.  Eyes:     Conjunctiva/sclera: Conjunctivae normal.  Cardiovascular:     Rate and Rhythm: Normal rate and regular rhythm.  Pulmonary:     Effort: Pulmonary effort  is normal.     Breath sounds: Normal breath sounds.  Abdominal:     General: Abdomen is flat. Bowel sounds are normal.     Palpations: Abdomen is soft.  Musculoskeletal:        General: Normal range of motion.     Cervical back: Normal range of motion.  Skin:    General: Skin is warm and dry.  Neurological:     General: No focal deficit present.     Mental Status: He is alert.  Psychiatric:        Mood and Affect: Mood normal.     Data Reviewed: Relevant notes from primary care and specialist visits, past discharge summaries as available in EHR, including Care Everywhere. Prior diagnostic testing as pertinent to current admission diagnoses Updated medications and problem lists for reconciliation ED course, including vitals, labs, imaging, treatment and response to  treatment Triage notes, nursing and pharmacy notes and ED provider's notes Notable results as noted in HPI Labs reviewed.  Troponin 72, cholesterol 211, triglycerides 172, HDL 47, sodium 140, potassium 3.3, chloride 110, bicarb 23, glucose 123, BUN 9, creatinine 0.79, calcium 8.3, total protein 6.8, albumin 3.7, AST 33, ALT 20, alkaline phosphatase 74, total bilirubin 0.5, white count 12.2, hemoglobin 15.4, hematocrit 46.2, platelet count 225 Twelve-lead EKG reviewed by me showed inferior ST elevations There are no new results to review at this time.  Assessment and Plan: * Acute ST elevation myocardial infarction (STEMI) of inferior wall [facility]) Patient presents via EMS as a code STEMI and was found to have an inferior wall ST elevation MI He was taken emergently to the cardiac Cath Lab and found to have severe single vessel CAD - 100% =>  Treated successfully with PTCA followed DES PCI using Onyx Frontier DES 2.5 x 26 mm postdilated to 3.1 mm. - restored TIMI 3 flow with lesion reduced to 0% Post PTCA reperfusion - prolonged AIVR episodes with hypotension for which patient received a short run of Levophed ~ 4 mcg/min, stopped post PCI Relatively normal left coronary System. Relatively preserved LV function with apical inferior hypokinesis. EF roughly 45 to 50%. Normal EDP  Continue aspirin, Brilinta, metoprolol and atorvastatin  Cardiac arrest with successful resuscitation [facility]) Patient is status post V-fib arrest en route to the hospital and received 1 shock and CPR with ROSC He is awake, alert and back to his baseline  Current smoker Smoking cessation has been discussed with patient in detail Will place patient on a nicotine transdermal patch 21 mg daily  Essential hypertension Patient with a history of hypertension not on any antihypertensive medications Continue low-dose metoprolol  Alcohol dependence (HCC) Patient admits to daily alcohol use Will monitor closely for signs and  symptoms of alcohol withdrawal Place patient on CIWA protocol and administer lorazepam for CIWA score of 8 or greater to  Hypokalemia Supplement potassium Check magnesium levels  Hyperlipidemia with target LDL less than 70 Continue atorvastatin      Advance Care Planning:   Code Status: Full Code   Consults: Cardiology  Family Communication: Greater than 50% of time was spent discussing patient's condition and plan of care with him at the bedside.  All questions and concerns have been addressed.  He verbalizes understanding and agrees to the plan  Severity of Illness: The appropriate patient status for this patient is INPATIENT. Inpatient status is judged to be reasonable and necessary in order to provide the required intensity of service to ensure the patient's safety. The patient's  presenting symptoms, physical exam findings, and initial radiographic and laboratory data in the context of their chronic comorbidities is felt to place them at high risk for further clinical deterioration. Furthermore, it is not anticipated that the patient will be medically stable for discharge from the hospital within 2 midnights of admission.   * I certify that at the point of admission it is my clinical judgment that the patient will require inpatient hospital care spanning beyond 2 midnights from the point of admission due to high intensity of service, high risk for further deterioration and high frequency of surveillance required.*  Author: Lucile Shutters, MD 03/08/2022 12:18 PM  For on call review [email or web address].

## 2022-03-08 NOTE — Assessment & Plan Note (Addendum)
Replaced. °

## 2022-03-08 NOTE — Assessment & Plan Note (Addendum)
-  Nicotine patch 

## 2022-03-08 NOTE — Assessment & Plan Note (Addendum)
Patient is status post V-fib arrest en route to the hospital and received 1 shock and CPR with ROSC

## 2022-03-08 NOTE — ED Notes (Addendum)
Pt comes in via [facility] with complaints of left arm and jaw pain. Pt stated he was on his way to work, when he felt the pain. Pt encoded by [facility]  as a code STEMI.  En route pt received four baby aspirins. Pt then went into VFIb, was shocked once at 360J,and had one round of cpr and regained consciousness.   Pt alert and oriented on arrival, with no complaints of n/v or jaw pain. Pt still experiencing left arm pain. PT has two 18 gauge iv's in the left arm. One in the forearm and one in the ac.  Vitals: BP 119/72, pulse 72, Spo2 95%, Respirations 20  Pt transported to cathlab

## 2022-03-08 NOTE — Assessment & Plan Note (Addendum)
Switch to Toprol-XL.

## 2022-03-08 NOTE — Consult Note (Addendum)
Cardiology Consultation   Patient ID: Christopher Rosario MRN: [id number]; DOB: 26-Jul-1960  Admit date: 03/08/2022 Date of Consult: 03/08/2022  PCP:  Trinna Post, PA-C (Inactive)   [place] Cardiologist:  None        Patient Profile:   Christopher Rosario is a 61 y.o. male current smoker with a hx of untreated HTN & HLD who is being seen 03/08/2022 for the evaluation of Inferior STEMI complicated by out-of-hospital VF arrest shock x 1 at the request of [facility] EMS..  History of Present Illness:   Christopher Rosario was in his usual state of health until this Sunday, 03/04/2022-he had a short episode of left arm and jaw pain that went away spontaneously.  He just been feeling tired and weak over the last couple days but this morning 03/08/2022 he woke at roughly 0530 with left-sided jaw and arm pain that did not resolve.  He initially started to go to work, but halfway to work decided to come and go home as he was still feeling pretty significant discomfort.  EMS was contacted.  Upon arrival he was noted to have significant 5 to 6 mm inferior ST elevations consistent with inferior STEMI.  Code STEMI was called.  He was transported to via EMS to [facility] ER.  5 minutes after the ER patient suffered an episode of VF arrest resolved after 1 shock and brief CPR with ROSC.  He was awake and alert and hemodynamically stable upon arrival to the ER.  Still complaining of 8 out of 10 left-sided jaw and arm pain.  He did have some mild nausea and mild dyspnea but no other untoward symptoms. Other than the episode of jaw and arm pain on Sunday, he has not had any antecedent symptoms of chest pain or pressure at rest or exertion.  Never really actually had chest pain.  No PND orthopnea edema.  No arrhythmia symptoms.   No past medical history on file.  Past Surgical History:  Procedure Laterality Date   NO PAST SURGERIES       Home Medications:  Prior to Admission medications   Medication Sig Start Date End Date  Taking? Authorizing Provider  amLODipine (NORVASC) 10 MG tablet TAKE 1 TABLET BY MOUTH EVERY DAY 09/27/19   Trinna Post, PA-C  atorvastatin (LIPITOR) 20 MG tablet Take 1 tablet (20 mg total) by mouth at bedtime. 04/09/18   Trinna Post, PA-C    Inpatient Medications: Scheduled Meds:  Continuous Infusions:  PRN Meds:   Allergies:   No Known Allergies  Social History:   Social History   Socioeconomic History   Marital status: Married    Spouse name: Not on file   Number of children: Not on file   Years of education: Not on file   Highest education level: Not on file  Occupational History   Not on file  Tobacco Use   Smoking status: Every Day    Packs/day: 1.00    Years: 35.00    Total pack years: 35.00    Types: Cigarettes   Smokeless tobacco: Never  Vaping Use   Vaping Use: Never used  Substance and Sexual Activity   Alcohol use: Yes    Alcohol/week: 7.0 standard drinks of alcohol    Types: 7 Cans of beer per week   Drug use: No   Sexual activity: Not on file  Other Topics Concern   Not on file  Social History Narrative   Not on file  Social Determinants of Health   Financial Resource Strain: Not on file  Food Insecurity: Not on file  Transportation Needs: Not on file  Physical Activity: Not on file  Stress: Not on file  Social Connections: Not on file  Intimate Partner Violence: Not on file    Family History:    Family History  Problem Relation Age of Onset   Stroke Mother    Cancer Mother    Breast cancer Mother    Aneurysm Father    Hypertension Father    Healthy Sister    Prostate cancer Maternal Uncle      ROS:  Please see the history of present illness.  Review of Systems - Negative except symptoms noted in HPI Respiratory ROS: no cough, shortness of breath, or wheezing He had a little bit of dyspnea right after the shock Cardiovascular ROS: positive for - not really chest pain-he was left-sided jaw and arm pain. negative for -  edema, irregular heartbeat, loss of consciousness, orthopnea, palpitations, paroxysmal nocturnal dyspnea, rapid heart rate, shortness of breath, or syncope or near syncope, TIA/amaurosis fugax, claudication   All other ROS reviewed and negative.     Physical Exam/Data:  There were no vitals filed for this visit. No intake or output data in the 24 hours ending 03/08/22 0906    12/17/2018    1:24 PM 04/07/2018    1:57 PM 02/24/2018    1:23 PM  Last 3 Weights  Weight (lbs) 159 lb 158 lb 14.4 oz 160 lb  Weight (kg) 72.122 kg 72.077 kg 72.576 kg     There is no height or weight on file to calculate BMI.  General:  Well nourished, well developed, in no moderate distress; mildly diaphoretic HEENT: normal Neck: no JVD Vascular: No carotid bruits; Distal pulses 2+ bilaterally Cardiac:  normal S1, S2; RRR; no murmur , rubs or gallops Lungs:  clear to auscultation bilaterally, no wheezing, rhonchi or rales  Abd: soft, nontender, no hepatomegaly  Ext: no edema Musculoskeletal:  No deformities, BUE and BLE strength normal and equal Skin: warm and dry  Neuro:  CNs 2-12 intact, no focal abnormalities noted Psych:  Normal affect   EKG:  The EKG was personally reviewed and demonstrates: I reviewed that EKG -sinus rhythm with 5 to 6 mm ST elevations in inferior leads with depressions in the septal and anterolateral leads concerning for RV infarct.  Heart rate was 56 bpm sinus rhythm. Telemetry:  Telemetry was personally reviewed and demonstrates: Sinus rhythm  Rhythm strip from EMS reviewed, the episode of VT seen reported VF.  1 shock performed.  Per shocks there was brief run of PVCs but persistent ST elevations.  Relevant CV Studies: None  Laboratory Data:  High Sensitivity Troponin:  No results for input(s): "TROPONINIHS" in the last 720 hours.   ChemistryNo results for input(s): "NA", "K", "CL", "CO2", "GLUCOSE", "BUN", "CREATININE", "CALCIUM", "MG", "GFRNONAA", "GFRAA", "ANIONGAP" in the  last 168 hours.  No results for input(s): "PROT", "ALBUMIN", "AST", "ALT", "ALKPHOS", "BILITOT" in the last 168 hours. Lipids No results for input(s): "CHOL", "TRIG", "HDL", "LABVLDL", "LDLCALC", "CHOLHDL" in the last 168 hours.  HematologyNo results for input(s): "WBC", "RBC", "HGB", "HCT", "MCV", "MCH", "MCHC", "RDW", "PLT" in the last 168 hours. Thyroid No results for input(s): "TSH", "FREET4" in the last 168 hours.  BNPNo results for input(s): "BNP", "PROBNP" in the last 168 hours.  DDimer No results for input(s): "DDIMER" in the last 168 hours.   Radiology/Studies:  No results  found.   Assessment and Plan:   Principal Problem:   Acute ST elevation myocardial infarction (STEMI) of inferior wall (HCC) Active Problems:   Cardiac arrest with successful resuscitation [facility])   Essential hypertension   Hyperlipidemia with target LDL less than 70   Current smoker   Inferior STEMI  Chest pain status post VF arrest with 1 shock.  Was ROSC after 1 shock.  Hemodynamically stable.  Pain resolved initially and now returning.  Do not anticipate amiodarone unless he has recurrent PSVT.  Plan-emergent cardiac catheterization. Further plans per Report  If blood pressure tolerates, would start low-dose beta-blocker 12.5 mg twice daily along with atorvastatin 80 mg. Anticipate DAPT.  Risk Assessment/Risk Scores:     TIMI Risk Score for ST  Elevation MI:   The patient's TIMI risk score is 3, which indicates a 4.4% risk of all cause mortality at 30 days  S/p VF Arrest - Shock x 1  For questions or updates, please contact [facility] Please consult [email or web address] for contact info under    Signed, Bryan Lemma, MD  03/08/2022 9:06 AM

## 2022-03-08 NOTE — Assessment & Plan Note (Addendum)
No current signs of withdrawal. 

## 2022-03-08 NOTE — ED Provider Notes (Signed)
[facility] Provider Note    Event Date/Time   First MD Initiated Contact with Patient 03/08/22 [id number]     (approximate)  History   Chief Complaint: Chest pain  HPI  Christopher Rosario is a 61 y.o. male with no past medical history takes no medications who presents to the emergency department for chest pain as a code STEMI.  According to the patient and EMS report patient developed left arm pain around 5:30 AM.  Some progression to the chest patient ultimately called EMS.  EMS obtained an EKG on scene found to be consistent with an inferior STEMI which I reviewed prior to arrival.  Code STEMI was activated.  EMS states and route to the hospital patient lost consciousness with a rhythm of ventricular fibrillation.  Was defibrillated x 1.  Upon arrival to the emergency department patient was awake alert oriented.  Patient continues to state moderate left arm pain and pressure but no chest pain.  Patient does admit to daily tobacco use/smoking as well as daily alcohol use.  No cardiac history.  Physical Exam   Most recent vital signs: There were no vitals filed for this visit.  General: Awake, no distress.  Somewhat pale in appearance. CV:  Good peripheral perfusion.  Regular rate and rhythm  Resp:  Normal effort.  Equal breath sounds bilaterally.  Abd:  No distention.  Soft, nontender.  No rebound or guarding. Other:  Lower extremity edema.   ED Results / Procedures / Treatments   EKG  EKG viewed and interpreted by myself (EMS EKG) shows what appears to be a sinus rhythm at 56 bpm with a narrow QRS, normal axis, largely normal intervals.  Patient has significant ST elevation in leads II, III, aVF with reciprocal depressions in leads I and aVL as well as V1 and V2.  Consistent with inferior STEMI.  RADIOLOGY  No x-ray performed prior to Cath Lab   MEDICATIONS ORDERED IN ED: Medications - No data to display   IMPRESSION / MDM / ASSESSMENT AND PLAN / ED COURSE  I  reviewed the triage vital signs and the nursing notes.  Patient's presentation is most consistent with acute presentation with potential threat to life or bodily function.  Patient presents to the emergency department as an emergency traffic STEMI alert.  Defibrillated x 1 for ventricular fibrillation and route to the hospital.  EMS states they started chest compressions however the patient woke up shortly after defibrillation.  Upon arrival patient is awake alert oriented answering questions appropriately.  Patient states he felt somewhat unwell last night but denies any pain or discomfort until 530 this morning when he was going to work.  Continues to have pain and pressure in his left arm.  EKG consistent with inferior STEMI.  Cardiology is present upon patient arrival.  Brief history obtained by myself and cardiologist, lab work obtained, patient remained in what appeared to be a sinus rhythm and was transported emergently to the cardiac Cath Lab.  Patient remained on EMS stretcher throughout evaluation.  CRITICAL CARE Performed by: Minna Antis   Total critical care time: 20 minutes  Critical care time was exclusive of separately billable procedures and treating other patients.  Critical care was necessary to treat or prevent imminent or life-threatening deterioration.  Critical care was time spent personally by me on the following activities: development of treatment plan with patient and/or surrogate as well as nursing, discussions with consultants, evaluation of patient's response to treatment, examination of patient,  obtaining history from patient or surrogate, ordering and performing treatments and interventions, ordering and review of laboratory studies, ordering and review of radiographic studies, pulse oximetry and re-evaluation of patient's condition.   FINAL CLINICAL IMPRESSION(S) / ED DIAGNOSES   Inferior STEMI  Note:  This document was prepared using Dragon voice  recognition software and may include unintentional dictation errors.   Minna Antis, MD 03/08/22 [phone]

## 2022-03-09 ENCOUNTER — Encounter: Payer: Self-pay | Admitting: Cardiology

## 2022-03-09 DIAGNOSIS — F17200 Nicotine dependence, unspecified, uncomplicated: Secondary | ICD-10-CM

## 2022-03-09 DIAGNOSIS — F1029 Alcohol dependence with unspecified alcohol-induced disorder: Secondary | ICD-10-CM

## 2022-03-09 DIAGNOSIS — I469 Cardiac arrest, cause unspecified: Secondary | ICD-10-CM

## 2022-03-09 DIAGNOSIS — E876 Hypokalemia: Secondary | ICD-10-CM

## 2022-03-09 DIAGNOSIS — I10 Essential (primary) hypertension: Secondary | ICD-10-CM

## 2022-03-09 DIAGNOSIS — E785 Hyperlipidemia, unspecified: Secondary | ICD-10-CM

## 2022-03-09 DIAGNOSIS — I2111 ST elevation (STEMI) myocardial infarction involving right coronary artery: Secondary | ICD-10-CM

## 2022-03-09 HISTORY — PX: TRANSTHORACIC ECHOCARDIOGRAM: SHX275

## 2022-03-09 LAB — LIPID PANEL
Cholesterol: 218 mg/dL — ABNORMAL HIGH (ref 0–200)
HDL: 51 mg/dL (ref >40)
LDL Cholesterol: 142 mg/dL — ABNORMAL HIGH (ref 0–99)
Total CHOL/HDL Ratio: 4.3 RATIO
Triglycerides: 127 mg/dL (ref <150)
VLDL: 25 mg/dL (ref 0–40)

## 2022-03-09 LAB — CBC
HCT: 43.2 % (ref 39.0–52.0)
Hemoglobin: 14.8 g/dL (ref 13.0–17.0)
MCH: 33.2 pg (ref 26.0–34.0)
MCHC: 34.3 g/dL (ref 30.0–36.0)
MCV: 96.9 fL (ref 80.0–100.0)
Platelets: 204 10*3/uL (ref 150–400)
RBC: 4.46 MIL/uL (ref 4.22–5.81)
RDW: 11.4 % — ABNORMAL LOW (ref 11.5–15.5)
WBC: 8.2 10*3/uL (ref 4.0–10.5)
nRBC: 0 % (ref 0.0–0.2)

## 2022-03-09 LAB — GLUCOSE, CAPILLARY
Glucose-Capillary: 104 mg/dL — ABNORMAL HIGH (ref 70–99)
Glucose-Capillary: 140 mg/dL — ABNORMAL HIGH (ref 70–99)
Glucose-Capillary: 92 mg/dL (ref 70–99)

## 2022-03-09 LAB — BASIC METABOLIC PANEL
Anion gap: 5 (ref 5–15)
BUN: 10 mg/dL (ref 8–23)
CO2: 23 mmol/L (ref 22–32)
Calcium: 8.5 mg/dL — ABNORMAL LOW (ref 8.9–10.3)
Chloride: 111 mmol/L (ref 98–111)
Creatinine, Ser: 0.67 mg/dL (ref 0.61–1.24)
GFR, Estimated: >60 mL/min (ref >60)
Glucose, Bld: 112 mg/dL — ABNORMAL HIGH (ref 70–99)
Potassium: 3.9 mmol/L (ref 3.5–5.1)
Sodium: 139 mmol/L (ref 135–145)

## 2022-03-09 MED ORDER — LOSARTAN POTASSIUM 25 MG PO TABS
12.5000 mg | ORAL_TABLET | Freq: Every day | ORAL | Status: DC
  Administered 2022-03-09 – 2022-03-10 (×2): 12.5 mg via ORAL
  Filled 2022-03-09: qty 0.5
  Filled 2022-03-09: qty 1

## 2022-03-09 NOTE — Progress Notes (Signed)
Report called to Crystal on 2A, for patient to transfer to room 242. Pt to transfer via wheelchair.

## 2022-03-09 NOTE — Progress Notes (Signed)
  Progress Note   Patient: Christopher Rosario PYK:[id number] DOB: 12/09/1960 DOA: 03/08/2022     1 DOS: the patient was seen and examined on 03/09/2022    Assessment and Plan: * Acute ST elevation myocardial infarction (STEMI) of inferior wall (HCC) Inferior wall STEMI with stent placed in the right RCA.  Continue aspirin, Brilinta, metoprolol, atorvastatin.  Cardiac arrest with successful resuscitation [facility]) Patient is status post V-fib arrest en route to the hospital and received 1 shock and CPR with ROSC   Current smoker Nicotine patch  Essential hypertension Low-dose metoprolol  Alcohol dependence (HCC) No current signs of withdrawal  Hypokalemia Replaced  Hyperlipidemia with target LDL less than 70 Continue atorvastatin.  LDL 142, goal less than 70        Subjective: Patient admitted with STEMI.  Feeling well right now.  No complaints of chest pain or shortness of breath  Physical Exam: Vitals:   03/09/22 0900 03/09/22 1000 03/09/22 1100 03/09/22 1200  BP: (!) 133/92 124/87 (!) 104/58 (!) 135/90  Pulse: 68 78 72 74  Resp: 19 (!) 22 16 20   Temp:      TempSrc:      SpO2: 94% 93% 96% 94%  Weight:      Height:       Physical Exam HENT:     Head: Normocephalic.     Mouth/Throat:     Pharynx: No oropharyngeal exudate.  Eyes:     General: Lids are normal.     Conjunctiva/sclera: Conjunctivae normal.  Cardiovascular:     Rate and Rhythm: Normal rate and regular rhythm.     Heart sounds: Normal heart sounds, S1 normal and S2 normal.  Pulmonary:     Breath sounds: No decreased breath sounds, wheezing, rhonchi or rales.  Abdominal:     Palpations: Abdomen is soft.     Tenderness: There is no abdominal tenderness.  Musculoskeletal:     Right lower leg: No swelling.     Left lower leg: No swelling.  Skin:    General: Skin is warm.     Findings: No rash.  Neurological:     Mental Status: He is alert and oriented to person, place, and time.     Data Reviewed: LDL  142, CBC within normal limits, creatinine 0.67  Family Communication: Declined  Disposition: Status is: Inpatient Remains inpatient appropriate because: With a STEMI would watch overnight and discharge on 12/9 if stable.  Planned Discharge Destination: Home    Time spent: 28 minutes  Author: 14/9, MD 03/09/2022 12:11 PM  For on call review [email or web address].

## 2022-03-09 NOTE — Progress Notes (Signed)
Progress Note  Patient Name: Christopher Rosario Date of Encounter: 03/09/2022  Primary Cardiologist: new - consult by Herbie Baltimore  Subjective   No chest pain, dyspnea, dizziness, or palpitations. Feels, "great." Post cath labs stable.   Inpatient Medications    Scheduled Meds:  aspirin  81 mg Oral Daily   atorvastatin  80 mg Oral QPM   Chlorhexidine Gluconate Cloth  6 each Topical Daily   folic acid  1 mg Oral Daily   heparin  5,000 Units Subcutaneous Q8H   LORazepam  0-4 mg Oral Q6H   Followed by   Melene Muller ON 03/10/2022] LORazepam  0-4 mg Oral Q12H   metoprolol tartrate  12.5 mg Oral BID   multivitamin with minerals  1 tablet Oral Daily   nicotine  21 mg Transdermal Daily   sodium chloride flush  3 mL Intravenous Q12H   thiamine  100 mg Oral Daily   Or   thiamine  100 mg Intravenous Daily   ticagrelor  90 mg Oral BID   Continuous Infusions:  sodium chloride     PRN Meds: sodium chloride, acetaminophen, LORazepam **OR** LORazepam, morphine injection, ondansetron (ZOFRAN) IV, sodium chloride flush   Vital Signs    Vitals:   03/09/22 0400 03/09/22 0500 03/09/22 0600 03/09/22 0700  BP: 127/78 117/74 127/77 (!) 141/89  Pulse: 67 76 80 76  Resp: 17 18 13 17   Temp: 98.2 F (36.8 C)     TempSrc: Oral     SpO2: 94% 95% 96% 95%  Weight:      Height:        Intake/Output Summary (Last 24 hours) at 03/09/2022 0811 Last data filed at 03/09/2022 0300 Gross per 24 hour  Intake 2252.63 ml  Output 1590 ml  Net 662.63 ml   Filed Weights   03/08/22 0928 03/08/22 1040  Weight: 72.6 kg 61.2 kg    Telemetry    SR with rare PVCs and a 5 beat run of NSVT - Personally Reviewed  ECG    No new tracings - Personally Reviewed  Physical Exam   GEN: No acute distress.   Neck: No JVD. Cardiac: RRR, no murmurs, rubs, or gallops. Right radial arteriotomy site without active bleeding, swelling, warmth, erythema, bruising, or TTP. Radial pulse 2+ proximal and distal to the arteriotomy  site.  Respiratory: Clear to auscultation bilaterally.  GI: Soft, nontender, non-distended.   MS: No edema; No deformity. Neuro:  Alert and oriented x 3; Nonfocal.  Psych: Normal affect.  Labs    Chemistry Recent Labs  Lab 03/08/22 0856 03/08/22 0944 03/09/22 0450  NA 140 126* 139  K 3.3* 3.0* 3.9  CL 110  --  111  CO2 23  --  23  GLUCOSE 123*  --  112*  BUN 9  --  10  CREATININE 0.79  --  0.67  CALCIUM 8.3*  --  8.5*  PROT 6.8  --   --   ALBUMIN 3.7  --   --   AST 33  --   --   ALT 20  --   --   ALKPHOS 74  --   --   BILITOT 0.5  --   --   GFRNONAA >60  --  >60  ANIONGAP 7  --  5     Hematology Recent Labs  Lab 03/08/22 0856 03/08/22 0944 03/09/22 0450  WBC 12.2*  --  8.2  RBC 4.66  --  4.46  HGB 15.4 13.9 14.8  HCT  46.2 41.0 43.2  MCV 99.1  --  96.9  MCH 33.0  --  33.2  MCHC 33.3  --  34.3  RDW 11.6  --  11.4*  PLT 225  --  204    Cardiac EnzymesNo results for input(s): "TROPONINI" in the last 168 hours. No results for input(s): "TROPIPOC" in the last 168 hours.   BNPNo results for input(s): "BNP", "PROBNP" in the last 168 hours.   DDimer No results for input(s): "DDIMER" in the last 168 hours.   Radiology     Cardiac Studies   2D echo 03/08/2022: 1. Left ventricular ejection fraction, by estimation, is 50 to 55%. The  left ventricle has low normal function. The left ventricle demonstrates  regional wall motion abnormalities (Inferior/posterior wall hypokinesis).  Left ventricular diastolic  parameters are consistent with Grade I diastolic dysfunction (impaired  relaxation).   2. Right ventricular systolic function is normal. The right ventricular  size is normal.   3. The mitral valve is normal in structure. Trivial mitral valve  regurgitation. No evidence of mitral stenosis.   4. The aortic valve is normal in structure. Aortic valve regurgitation is  trivial. No aortic stenosis is present.   5. The inferior vena cava is normal in size with  greater than 50%  respiratory variability, suggesting right atrial pressure of 3 mmHg. __________  LHC 03/08/2022:   CULPRIT mid RCA lesion is 100% stenosed.   A drug-eluting stent was successfully placed using a STENT ONYX FRONTIER 2.5X26.  Postdilated to 3.1 mm   Post intervention, there is a 0% residual stenosis.   Dist RCA lesion is 20% stenosed.   ----------------------------------   There is mild left ventricular systolic dysfunction. The left ventricular ejection fraction is 45-50% by visual estimate.   LV end diastolic pressure is normal.   There is no aortic valve stenosis.   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 - restored TIMI 3 flow with lesion reduced to 0% Post PTCA reperfusion - prolonged AIVR episodes with hypotension--> short run of Levophed ~ 4 mcg/min, stopped post PCI Relatively normal left coronary System. Relatively preserved LV function with apical inferior hypokinesis. EF roughly 45 to 50%. Normal EDP      RECOMMENDATIONS: Admit to [facility] with [facility] and consultation Echocardiogram ordered.  Troponin cycled. FLP, LP(a) checked-started on high-dose statin Start low-dose beta-blocker Smoking cessation counseling  Patient Profile     61 y.o. male with history of untreated HTN, HLD and ongoing tobacco use who presented to [facility] on 12/7 with an inferior STEMI complicated by out-of-hospital VF arrest s/p shock x 1.   Assessment & Plan    1. Inferior STEMI complicated by out-of-hospital VF arrest s/p shock x 1: -No further angina -DAPT with ASA 81 mg and Brilinta 90 mg bid without interruption for at least 12 months from date of PCI -Importance of DAPT discussed  -Continue newly started metoprolol and Lipitor -Add losartan -Post cath instructions -Cardiac rehab -Discontinue IV fluids -Aggressive  risk factor modification   2. HTN: -Blood pressure mildly elevated -Add losartan -Continue newly started metoprolol  -Monitor   3. HLD: -LDL 142 with goal LDL < 55 -Now on Lipitor 80 mg -Follow up FLP and LFT in the office in 2 months with recommendation to escalate lipid therapy as indicated to achieve target  LDL  4. Hypokalemia: -Improved  5. Tobacco use: -Nicotine patch in place -Complete cessation recommended    -Ok to transfer to progressive care today with possibly discharge 12/9      For questions or updates, please contact [facility] Please consult [email or web address] for contact info under Cardiology/STEMI.    Signed, Eula Listen, PA-C [facility] Pager: [phone] 03/09/2022, 8:11 AM

## 2022-03-10 MED ORDER — METOPROLOL SUCCINATE ER 25 MG PO TB24: 25.0000 mg | ORAL_TABLET | Freq: Every day | ORAL | 0 refills | Status: DC

## 2022-03-10 MED ORDER — LOSARTAN POTASSIUM 25 MG PO TABS: 12.5000 mg | ORAL_TABLET | Freq: Every day | ORAL | 0 refills | Status: DC

## 2022-03-10 MED ORDER — FOLIC ACID 1 MG PO TABS: 1.0000 mg | ORAL_TABLET | Freq: Every day | ORAL | 0 refills | Status: AC

## 2022-03-10 MED ORDER — ASPIRIN 81 MG PO CHEW: 81.0000 mg | CHEWABLE_TABLET | Freq: Every day | ORAL | 0 refills | Status: AC

## 2022-03-10 MED ORDER — TICAGRELOR 90 MG PO TABS: 90.0000 mg | ORAL_TABLET | Freq: Two times a day (BID) | ORAL | 0 refills | Status: DC

## 2022-03-10 MED ORDER — ATORVASTATIN CALCIUM 80 MG PO TABS: 80.0000 mg | ORAL_TABLET | Freq: Every evening | ORAL | 0 refills | Status: DC

## 2022-03-10 MED ORDER — VITAMIN B-1 100 MG PO TABS: 100.0000 mg | ORAL_TABLET | Freq: Every day | ORAL | 0 refills | Status: AC

## 2022-03-10 MED ORDER — NICOTINE 21 MG/24HR TD PT24: MEDICATED_PATCH | TRANSDERMAL | 0 refills | Status: AC

## 2022-03-10 NOTE — TOC Transition Note (Signed)
Transition of Care [facility]) - CM/SW Discharge Note   Patient Details  Name: Que Meneely MRN: [id number] Date of Birth: 03-04-1961  Transition of Care [facility]) CM/SW Contact:  Luvenia Redden, RN Phone Number:[phone] 03/10/2022, 9:48 AM   Clinical Narrative:       Final next level of care: Home/Self Care Barriers to Discharge: Barriers Resolved   Patient Goals and CMS Choice  [facility] RN spoke with pt abe bedside concerning any needs. Pt needed a Brilinta 30 days coupon for prescription medication. Pt very appreciative and grateful for the coupon. Pt states he has a ride with no additional needs.  TOC RN will continue to follow however no additional needs at this time.      Discharge Placement                    Patient and family notified of of transfer: 03/10/22  Discharge Plan and Services                                     Social Determinants of Health (SDOH) Interventions     Readmission Risk Interventions     No data to display

## 2022-03-10 NOTE — Discharge Summary (Signed)
Physician Discharge Summary   Patient: Christopher Rosario MRN: [id number] DOB: [birth year]  Admit date:     03/08/2022  Discharge date: 03/10/22  Discharge Physician: Alford Highland   PCP: Trey Sailors, PA-C (Inactive)   Recommendations at discharge:   Follow-up with your medical doctor Follow-up cardiology 1 week  Discharge Diagnoses: Principal Problem:   Acute ST elevation myocardial infarction (STEMI) of inferior wall (HCC) Active Problems:   Cardiac arrest with successful resuscitation [facility])   Current smoker   Essential hypertension   Hyperlipidemia with target LDL less than 70   Hypokalemia   Alcohol dependence [facility])   Hospital Course: 61 year old man with past medical history of smoking hypertension hyperlipidemia presented with left arm pain and jaw pain and was diagnosed with a STEMI.  He was defibrillated in the field.  He was brought urgently to the cardiac catheterization lab and patient had a stent placed in the 100% blockage of the mid RCA.  Echocardiogram showed an EF of 50%.  Patient started on aspirin, Brilinta, metoprolol and Lipitor.  Assessment and Plan: * Acute ST elevation myocardial infarction (STEMI) of inferior wall (HCC) Inferior wall STEMI with stent placed in the right RCA.  Continue aspirin, Brilinta, metoprolol, atorvastatin.  Cardiac arrest with successful resuscitation [facility]) Patient is status post V-fib arrest en route to the hospital and received 1 shock and CPR with ROSC   Current smoker Nicotine patch  Essential hypertension Switch to Toprol-XL.  Alcohol dependence (HCC) No current signs of withdrawal  Hypokalemia Replaced  Hyperlipidemia with target LDL less than 70 Continue atorvastatin.  LDL 142, goal less than 70         Consultants: Cardiology Procedures performed: Cardiac cath with stents to the RCA Disposition: Home Diet recommendation:  Cardiac diet DISCHARGE MEDICATION: Allergies as of 03/10/2022   No Known  Allergies      Medication List     TAKE these medications    aspirin 81 MG chewable tablet Chew 1 tablet (81 mg total) by mouth daily. Start taking on: March 11, 2022   atorvastatin 80 MG tablet Commonly known as: LIPITOR Take 1 tablet (80 mg total) by mouth every evening. What changed:  medication strength how much to take when to take this   folic acid 1 MG tablet Commonly known as: FOLVITE Take 1 tablet (1 mg total) by mouth daily. Start taking on: March 11, 2022   losartan 25 MG tablet Commonly known as: COZAAR Take 0.5 tablets (12.5 mg total) by mouth daily. Start taking on: March 11, 2022   metoprolol succinate 25 MG 24 hr tablet Commonly known as: Toprol XL Take 1 tablet (25 mg total) by mouth at bedtime.   nicotine 21 mg/24hr patch Commonly known as: NICODERM CQ - dosed in mg/24 hours One patch chest wall daily (okay to substitute generic) Start taking on: March 11, 2022   thiamine 100 MG tablet Commonly known as: Vitamin B-1 Take 1 tablet (100 mg total) by mouth daily. Start taking on: March 11, 2022   ticagrelor 90 MG Tabs tablet Commonly known as: BRILINTA Take 1 tablet (90 mg total) by mouth 2 (two) times daily.        Follow-up Information     Trey Sailors, PA-C Follow up in 5 day(s).   Specialty: Physician Assistant Contact information: [address] [place] [address] [phone]         Sondra Barges, PA-C Follow up in 1 week(s).   Specialties: Cardiology,  Radiology Contact information: [address] [place] [address] [phone]                Discharge Exam: Filed Weights   03/08/22 0928 03/08/22 1040  Weight: 72.6 kg 61.2 kg   Physical Exam HENT:     Head: Normocephalic.     Mouth/Throat:     Pharynx: No oropharyngeal exudate.  Eyes:     General: Lids are normal.     Conjunctiva/sclera: Conjunctivae normal.  Cardiovascular:     Rate and Rhythm:  Normal rate and regular rhythm.     Heart sounds: Normal heart sounds, S1 normal and S2 normal.  Pulmonary:     Breath sounds: No decreased breath sounds, wheezing, rhonchi or rales.  Abdominal:     Palpations: Abdomen is soft.     Tenderness: There is no abdominal tenderness.  Musculoskeletal:     Right lower leg: No swelling.     Left lower leg: No swelling.  Skin:    General: Skin is warm.     Findings: No rash.  Neurological:     Mental Status: He is alert and oriented to person, place, and time.      Condition at discharge: stable  The results of significant diagnostics from this hospitalization (including imaging, microbiology, ancillary and laboratory) are listed below for reference.   Imaging Studies: ECHOCARDIOGRAM COMPLETE  Result Date: 03/08/2022    ECHOCARDIOGRAM REPORT   Patient Name:   Christopher Rosario Date of Exam: 03/08/2022 Medical Rec #:  [id number]  Height:       69.0 in Accession #:    [id number] Weight:       134.9 lb Date of Birth:  08/28/1960  BSA:          1.748 m Patient Age:    61 years   BP:           123/68 mmHg Patient Gender: M          HR:           85 bpm. Exam Location:  [facility] Procedure: 2D Echo, Cardiac Doppler and Color Doppler Indications:     Acute myocardial infarction I 21.9  History:         Patient has no prior history of Echocardiogram examinations. No                  past medical history on file.  Sonographer:     Cristela Blue Referring Phys:  [id number]ING Diagnosing Phys: Julien Nordmann MD  Sonographer Comments: No parasternal window. Image quality was good in apical views. IMPRESSIONS  1. Left ventricular ejection fraction, by estimation, is 50 to 55%. The left ventricle has low normal function. The left ventricle demonstrates regional wall motion abnormalities (Inferior/posterior wall hypokinesis). Left ventricular diastolic parameters are consistent with Grade I diastolic dysfunction (impaired relaxation).  2. Right ventricular systolic function  is normal. The right ventricular size is normal.  3. The mitral valve is normal in structure. Trivial mitral valve regurgitation. No evidence of mitral stenosis.  4. The aortic valve is normal in structure. Aortic valve regurgitation is trivial. No aortic stenosis is present.  5. The inferior vena cava is normal in size with greater than 50% respiratory variability, suggesting right atrial pressure of 3 mmHg. FINDINGS  Left Ventricle: Left ventricular ejection fraction, by estimation, is 50 to 55%. The left ventricle has low normal function. The left ventricle demonstrates regional wall motion abnormalities. The  left ventricular internal cavity size was normal in size. There is no left ventricular hypertrophy. Left ventricular diastolic parameters are consistent with Grade I diastolic dysfunction (impaired relaxation). Right Ventricle: The right ventricular size is normal. No increase in right ventricular wall thickness. Right ventricular systolic function is normal. Left Atrium: Left atrial size was normal in size. Right Atrium: Right atrial size was normal in size. Pericardium: There is no evidence of pericardial effusion. Mitral Valve: The mitral valve is normal in structure. Trivial mitral valve regurgitation. No evidence of mitral valve stenosis. Tricuspid Valve: The tricuspid valve is normal in structure. Tricuspid valve regurgitation is mild . No evidence of tricuspid stenosis. Aortic Valve: The aortic valve is normal in structure. Aortic valve regurgitation is trivial. No aortic stenosis is present. Aortic valve mean gradient measures 2.0 mmHg. Aortic valve peak gradient measures 3.6 mmHg. Pulmonic Valve: The pulmonic valve was normal in structure. Pulmonic valve regurgitation is not visualized. No evidence of pulmonic stenosis. Aorta: The aortic root is normal in size and structure. Venous: The inferior vena cava is normal in size with greater than 50% respiratory variability, suggesting right atrial  pressure of 3 mmHg. IAS/Shunts: No atrial level shunt detected by color flow Doppler.  LEFT VENTRICLE PLAX 2D LVIDd:         4.30 cm Diastology LVIDs:         3.10 cm LV e' medial:    5.22 cm/s LV PW:         1.00 cm LV E/e' medial:  8.8 LV IVS:        0.90 cm LV e' lateral:   5.33 cm/s                        LV E/e' lateral: 8.6  RIGHT VENTRICLE RV Basal diam:  2.60 cm RV Mid diam:    1.90 cm RV S prime:     12.50 cm/s TAPSE (M-mode): 1.9 cm LEFT ATRIUM             Index        RIGHT ATRIUM           Index LA diam:        2.60 cm 1.49 cm/m   RA Area:     11.30 cm LA Vol (A2C):   34.3 ml 19.63 ml/m  RA Volume:   25.40 ml  14.53 ml/m LA Vol (A4C):   22.9 ml 13.10 ml/m LA Biplane Vol: 30.8 ml 17.62 ml/m  AORTIC VALVE AV Vmax:           94.90 cm/s AV Vmean:          64.500 cm/s AV VTI:            0.183 m AV Peak Grad:      3.6 mmHg AV Mean Grad:      2.0 mmHg LVOT Vmax:         70.30 cm/s LVOT Vmean:        51.900 cm/s LVOT VTI:          0.153 m LVOT/AV VTI ratio: 0.84  AORTA Ao Root diam: 2.80 cm MITRAL VALVE               TRICUSPID VALVE MV Area (PHT): 4.83 cm    TR Peak grad:   18.1 mmHg MV Decel Time: 157 msec    TR Vmax:        213.00 cm/s MV E velocity: 46.10 cm/s  MV A velocity: 90.10 cm/s  SHUNTS MV E/A ratio:  0.51        Systemic VTI: 0.15 m Julien Nordmann MD Electronically signed by Julien Nordmann MD Signature Date/Time: 03/08/2022/1:24:52 PM    Final    CARDIAC CATHETERIZATION  Result Date: 03/08/2022   CULPRIT mid RCA lesion is 100% stenosed.   A drug-eluting stent was successfully placed using a STENT ONYX FRONTIER 2.5X26.  Postdilated to 3.1 mm   Post intervention, there is a 0% residual stenosis.   Dist RCA lesion is 20% stenosed.   ----------------------------------   There is mild left ventricular systolic dysfunction. The left ventricular ejection fraction is 45-50% by visual estimate.   LV end diastolic pressure is normal.   There is no aortic valve stenosis. POST-CATH DIAGNOSES Acute  Inferior STEMI with severe Single Vesel CAD - 100% => Treated successfully with PTCA followed DES PCI using Onyx Frontier DES 2.5 x 26 mm postdilated to 3.1 mm. - restored TIMI 3 flow with lesion reduced to 0% Post PTCA reperfusion - prolonged AIVR episodes with hypotension--> short run of Levophed ~ 4 mcg/min, stopped post PCI Relatively normal left coronary System. Relatively preserved LV function with apical inferior hypokinesis. EF roughly 45 to 50%. Normal EDP RECOMMENDATIONS: Admit to [facility] with [facility] and consultation Echocardiogram ordered.  Troponin cycled. FLP, LP(a) checked-started on high-dose statin Start low-dose beta-blocker Smoking cessation counseling Bryan Lemma, MD   Microbiology: Results for orders placed or performed during the hospital encounter of 03/08/22  MRSA Next Gen by PCR, Nasal     Status: None   Collection Time: 03/08/22 10:43 AM   Specimen: Nasal Mucosa; Nasal Swab  Result Value Ref Range Status   MRSA by PCR Next Gen NOT DETECTED NOT DETECTED Final    Comment: (NOTE) The GeneXpert MRSA Assay (FDA approved for NASAL specimens only), is one component of a comprehensive MRSA colonization surveillance program. It is not intended to diagnose MRSA infection nor to guide or monitor treatment for MRSA infections. Test performance is not FDA approved in patients less than 52 years old. Performed at [facility] [address] [place], [place] [address]     Labs: CBC: Recent Labs  Lab 03/08/22 0856 03/08/22 0944 03/09/22 0450  WBC 12.2*  --  8.2  NEUTROABS 8.6*  --   --   HGB 15.4 13.9 14.8  HCT 46.2 41.0 43.2  MCV 99.1  --  96.9  PLT 225  --  204   Basic Metabolic Panel: Recent Labs  Lab 03/08/22 0856 03/08/22 0944 03/08/22 1126 03/09/22 0450  NA 140 126*  --  139  K 3.3* 3.0*  --  3.9  CL 110  --   --  111  CO2 23  --   --  23  GLUCOSE 123*  --   --  112*  BUN 9  --   --  10  CREATININE 0.79  --   --  0.67  CALCIUM  8.3*  --   --  8.5*  MG  --   --  2.2  --    Liver Function Tests: Recent Labs  Lab 03/08/22 0856  AST 33  ALT 20  ALKPHOS 74  BILITOT 0.5  PROT 6.8  ALBUMIN 3.7   CBG: Recent Labs  Lab 03/08/22 1035 03/08/22 2113 03/09/22 0727 03/09/22 1229 03/09/22 1712  GLUCAP 102* 142* 104* 140* 92    Discharge time spent: greater than 30 minutes.  Signed:  Alford Highland, MD [facility] 03/10/2022

## 2022-03-10 NOTE — Hospital Course (Addendum)
61 year old man with past medical history of smoking hypertension hyperlipidemia presented with left arm pain and jaw pain and was diagnosed with a STEMI.  He was defibrillated in the field.  He was brought urgently to the cardiac catheterization lab and patient had a stent placed in the 100% blockage of the mid RCA.  Echocardiogram showed an EF of 50%.  Patient started on aspirin, Brilinta, metoprolol and Lipitor.

## 2022-03-10 NOTE — Progress Notes (Signed)
Progress Note  Patient Name: Christopher Rosario Date of Encounter: 03/10/2022  Primary Cardiologist: new - consult by Dewaine Conger   Transferred out of the ICU. No chest pain, dyspnea, dizziness, or palpitations. Feels, "great." Post cath vitals and labs stable.   Inpatient Medications    Scheduled Meds:  aspirin  81 mg Oral Daily   atorvastatin  80 mg Oral QPM   Chlorhexidine Gluconate Cloth  6 each Topical Daily   folic acid  1 mg Oral Daily   heparin  5,000 Units Subcutaneous Q8H   LORazepam  0-4 mg Oral Q6H   Followed by   LORazepam  0-4 mg Oral Q12H   losartan  12.5 mg Oral Daily   metoprolol tartrate  12.5 mg Oral BID   multivitamin with minerals  1 tablet Oral Daily   nicotine  21 mg Transdermal Daily   sodium chloride flush  3 mL Intravenous Q12H   thiamine  100 mg Oral Daily   Or   thiamine  100 mg Intravenous Daily   ticagrelor  90 mg Oral BID   Continuous Infusions:   PRN Meds: acetaminophen, LORazepam **OR** LORazepam, morphine injection, ondansetron (ZOFRAN) IV, sodium chloride flush   Vital Signs    Vitals:   03/09/22 1821 03/09/22 2052 03/10/22 0523 03/10/22 0803  BP: (!) 160/110 (!) 144/95 128/86 115/79  Pulse: 71 75 74 67  Resp: 14 16 16 16   Temp: 97.9 F (36.6 C) 97.9 F (36.6 C) 98.1 F (36.7 C) 98.5 F (36.9 C)  TempSrc:  Oral  Oral  SpO2: 100% 96% 97% 97%  Weight:      Height:        Intake/Output Summary (Last 24 hours) at 03/10/2022 0810 Last data filed at 03/09/2022 0824 Gross per 24 hour  Intake 360 ml  Output 550 ml  Net -190 ml    Filed Weights   03/08/22 0928 03/08/22 1040  Weight: 72.6 kg 61.2 kg    Telemetry    SR - Personally Reviewed  ECG    No new tracings - Personally Reviewed  Physical Exam   GEN: No acute distress.   Neck: No JVD. Cardiac: RRR, no murmurs, rubs, or gallops. Right radial arteriotomy site without active bleeding, swelling, warmth, erythema, bruising, or TTP. Radial pulse 2+ proximal and  distal to the arteriotomy site.  Respiratory: Clear to auscultation bilaterally.  GI: Soft, nontender, non-distended.   MS: No edema; No deformity. Neuro:  Alert and oriented x 3; Nonfocal.  Psych: Normal affect.  Labs    Chemistry Recent Labs  Lab 03/08/22 0856 03/08/22 0944 03/09/22 0450  NA 140 126* 139  K 3.3* 3.0* 3.9  CL 110  --  111  CO2 23  --  23  GLUCOSE 123*  --  112*  BUN 9  --  10  CREATININE 0.79  --  0.67  CALCIUM 8.3*  --  8.5*  PROT 6.8  --   --   ALBUMIN 3.7  --   --   AST 33  --   --   ALT 20  --   --   ALKPHOS 74  --   --   BILITOT 0.5  --   --   GFRNONAA >60  --  >60  ANIONGAP 7  --  5      Hematology Recent Labs  Lab 03/08/22 0856 03/08/22 0944 03/09/22 0450  WBC 12.2*  --  8.2  RBC 4.66  --  4.46  HGB 15.4 13.9 14.8  HCT 46.2 41.0 43.2  MCV 99.1  --  96.9  MCH 33.0  --  33.2  MCHC 33.3  --  34.3  RDW 11.6  --  11.4*  PLT 225  --  204     Cardiac EnzymesNo results for input(s): "TROPONINI" in the last 168 hours. No results for input(s): "TROPIPOC" in the last 168 hours.   BNPNo results for input(s): "BNP", "PROBNP" in the last 168 hours.   DDimer No results for input(s): "DDIMER" in the last 168 hours.   Radiology     Cardiac Studies   2D echo 03/08/2022: 1. Left ventricular ejection fraction, by estimation, is 50 to 55%. The  left ventricle has low normal function. The left ventricle demonstrates  regional wall motion abnormalities (Inferior/posterior wall hypokinesis).  Left ventricular diastolic  parameters are consistent with Grade I diastolic dysfunction (impaired  relaxation).   2. Right ventricular systolic function is normal. The right ventricular  size is normal.   3. The mitral valve is normal in structure. Trivial mitral valve  regurgitation. No evidence of mitral stenosis.   4. The aortic valve is normal in structure. Aortic valve regurgitation is  trivial. No aortic stenosis is present.   5. The inferior  vena cava is normal in size with greater than 50%  respiratory variability, suggesting right atrial pressure of 3 mmHg. __________  LHC 03/08/2022:   CULPRIT mid RCA lesion is 100% stenosed.   A drug-eluting stent was successfully placed using a STENT ONYX FRONTIER [id number].  Postdilated to 3.1 mm   Post intervention, there is a 0% residual stenosis.   Dist RCA lesion is 20% stenosed.   ----------------------------------   There is mild left ventricular systolic dysfunction. The left ventricular ejection fraction is 45-50% by visual estimate.   LV end diastolic pressure is normal.   There is no aortic valve stenosis.   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 - restored TIMI 3 flow with lesion reduced to 0% Post PTCA reperfusion - prolonged AIVR episodes with hypotension--> short run of Levophed ~ 4 mcg/min, stopped post PCI Relatively normal left coronary System. Relatively preserved LV function with apical inferior hypokinesis. EF roughly 45 to 50%. Normal EDP      RECOMMENDATIONS: Admit to [facility] with [facility] and consultation Echocardiogram ordered.  Troponin cycled. FLP, LP(a) checked-started on high-dose statin Start low-dose beta-blocker Smoking cessation counseling  Patient Profile     61 y.o. male with history of untreated HTN, HLD and ongoing tobacco use who presented to [facility] on 12/7 with an inferior STEMI complicated by out-of-hospital VF arrest s/p shock x 1.   Assessment & Plan    1. Inferior STEMI complicated by out-of-hospital VF arrest s/p shock x 1: -No further angina -DAPT with ASA 81 mg and Brilinta 90 mg bid without interruption for at least 12 months from date of PCI -Importance of DAPT discussed  -Continue newly started metoprolol, Lipitor, and losartan -Post cath instructions -Cardiac  rehab -Aggressive risk factor modification   2. HTN: -Blood pressure well controlled -Continue newly started metoprolol and losartan -Monitor   3. HLD: -LDL 142 with goal LDL < 55 -Now on Lipitor 80 mg -Follow up FLP and LFT in the office in 2 months with recommendation to escalate lipid therapy as indicated to  achieve target LDL  4. Hypokalemia: -Improved  5. Tobacco use: -Nicotine patch in place, will need at discharge -Complete cessation recommended   -Ok for discharge today once staffed by cardiology attending. I will arrange for follow up in our office      For questions or updates, please contact [facility] Please consult [email or web address] for contact info under Cardiology/STEMI.    Signed, Eula Listen, PA-C [facility] Pager: [phone] 03/10/2022, 8:10 AM

## 2022-03-10 NOTE — Progress Notes (Signed)
Patient requested no vitals at midnight as he would like to sleep.

## 2022-03-11 LAB — LIPOPROTEIN A (LPA): Lipoprotein (a): 189.6 nmol/L — ABNORMAL HIGH (ref <75.0)

## 2022-03-13 ENCOUNTER — Telehealth: Payer: Self-pay | Admitting: Physician Assistant

## 2022-03-13 NOTE — Telephone Encounter (Signed)
-----   Message from Sondra Barges, PA-C sent at 03/10/2022  8:17 AM EST ----- Please schedule patient with me for hospital follow up in 1-2 weeks. Thanks!

## 2022-03-13 NOTE — Telephone Encounter (Signed)
Called to schedule ?No VM  ?

## 2022-03-16 ENCOUNTER — Telehealth: Payer: Self-pay | Admitting: Cardiology

## 2022-03-16 NOTE — Telephone Encounter (Signed)
Attempted to call the patient with results and schedule cardiology follow up. No answer- the voice mail box is full.   Will call back at a later time.

## 2022-03-16 NOTE — Telephone Encounter (Signed)
Christopher Lex, MD 03/15/2022  9:25 PM EST     LP(a) checked in the setting of recent heart attack.  This is a marker for more aggressive cholesterol plaque and is treated somewhat differently than regular lipids.  This can be discussed in clinic follow-up with Mr. Christopher Rosario.  May recommend referral for more aggressive lipid management.   Bryan Lemma, MD

## 2022-03-16 NOTE — Telephone Encounter (Signed)
Called to schedule. Mailbox full.

## 2022-03-16 NOTE — Telephone Encounter (Signed)
Jefferey Pica, RN 03/16/2022 10:05 AM EST     Dunn, Raymon Mutton, PA-C  P Cv Div [facility] Triage Please schedule hospital follow up. Thanks

## 2022-03-19 NOTE — Telephone Encounter (Signed)
Call received from the call center this morning stating the patient and his employer were on the line. The patient went to work today- s/p STEMI & hospital discharge from [facility] on 03/10/22.  The patient's employer is stating they need something in writing that the employee can be at work today. Per the call center, the patient was told he could return to work today.  Call center advised I would need to reach out to Dr. Herbie Baltimore as there is no clear documentation regarding the patient's RTW.  I advised I would need to call the patient/ his HR back with MD recommendations.   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

## 2022-03-19 NOTE — Telephone Encounter (Signed)
I spoke with the patient regarding his results. He voices understanding. He is scheduled to follow up with Dr. Herbie Baltimore on 03/22/22 at 10:20 am. He is aware Dr. Herbie Baltimore will review his results further with him at that time.

## 2022-03-19 NOTE — Telephone Encounter (Signed)
Discussed with Dr. Herbie Baltimore by phone the patient's RTW status. Per Dr. Herbie Baltimore: The patient was a STEMI- has not been seen post d/c. He needs a f/u appt- with Alycia Rossetti or me. 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  MD advised he usually says at least 3 weeks out of work- but at least not until seen in clinic.   I have called and advised the patient of the above and he voices understanding.  I have offered him an appointment in the office with Dr. Herbie Baltimore on 03/22/22 @ 1020. The patient is agreeable. We have reviewed our office location. The patient has been advised to arrive 15 minutes prior to his visit and bring his medication bottles as well.  He was very appreciative of the call back.    Attempted to call Robin Scearce, HR rep at [phone]. No answer- I left a detailed message on Robin's identified voice mail that Mr. Bickford may not RTW at least until he is seen in the office on 03/22/22. I advised she may fax any needed documentation to [phone] or have the patient fax or bring this to his visit if needed.  I asked that she call back to the office with any questions/ concerns.

## 2022-03-22 ENCOUNTER — Encounter: Payer: Self-pay | Admitting: *Deleted

## 2022-03-22 ENCOUNTER — Ambulatory Visit: Payer: BC Managed Care – PPO | Attending: Cardiology | Admitting: Cardiology

## 2022-03-22 ENCOUNTER — Encounter: Payer: Self-pay | Admitting: Cardiology

## 2022-03-22 VITALS — BP 150/84 | HR 64 | Ht 69.0 in | Wt 154.6 lb

## 2022-03-22 DIAGNOSIS — I2119 ST elevation (STEMI) myocardial infarction involving other coronary artery of inferior wall: Secondary | ICD-10-CM | POA: Diagnosis not present

## 2022-03-22 DIAGNOSIS — I469 Cardiac arrest, cause unspecified: Secondary | ICD-10-CM | POA: Diagnosis not present

## 2022-03-22 DIAGNOSIS — F17200 Nicotine dependence, unspecified, uncomplicated: Secondary | ICD-10-CM | POA: Diagnosis not present

## 2022-03-22 DIAGNOSIS — I25119 Atherosclerotic heart disease of native coronary artery with unspecified angina pectoris: Secondary | ICD-10-CM | POA: Diagnosis not present

## 2022-03-22 DIAGNOSIS — Z955 Presence of coronary angioplasty implant and graft: Secondary | ICD-10-CM | POA: Insufficient documentation

## 2022-03-22 DIAGNOSIS — E785 Hyperlipidemia, unspecified: Secondary | ICD-10-CM | POA: Diagnosis not present

## 2022-03-22 DIAGNOSIS — I10 Essential (primary) hypertension: Secondary | ICD-10-CM

## 2022-03-22 HISTORY — DX: Atherosclerotic heart disease of native coronary artery with unspecified angina pectoris: I25.119

## 2022-03-22 MED ORDER — LOSARTAN POTASSIUM 25 MG PO TABS: 25.0000 mg | ORAL_TABLET | Freq: Every day | ORAL | 3 refills | Status: DC

## 2022-03-22 MED ORDER — METOPROLOL SUCCINATE ER 25 MG PO TB24: 25.0000 mg | ORAL_TABLET | Freq: Every day | ORAL | 0 refills | Status: DC

## 2022-03-22 MED ORDER — CHLORTHALIDONE 25 MG PO TABS: 12.5000 mg | ORAL_TABLET | Freq: Every day | ORAL | 3 refills | Status: DC

## 2022-03-22 NOTE — Patient Instructions (Addendum)
Medication Instructions:  Your physician has recommended you make the following change in your medication:   INCREASE Losartan to 25 mg once daily at night START Chlorthalidone 12.5 mg once daily in the morning TAKE Metoprolol at night   *If you need a refill on your cardiac medications before your next appointment, please call your pharmacy*   Lab Work: CBC, BMP in 2 weeks. No appointment is needed and please go to the following location: Medical Mall Entrance at [facility] 1st desk on the right to check in (REGISTRATION)  Lab hours: Monday- Friday (7:30 am- 5:30 pm)  If you have labs (blood work) drawn today and your tests are completely normal, you will receive your results only by: MyChart Message (if you have MyChart) OR A paper copy in the mail If you have any lab test that is abnormal or we need to change your treatment, we will call you to review the results.   Testing/Procedures: None   Follow-Up: At [facility], you and your health needs are our priority.  As part of our continuing mission to provide you with exceptional heart care, we have created designated Provider Care Teams.  These Care Teams include your primary Cardiologist (physician) and Advanced Practice Providers (APPs -  Physician Assistants and Nurse Practitioners) who all work together to provide you with the care you need, when you need it.  We recommend signing up for the patient portal called "MyChart".  Sign up information is provided on this After Visit Summary.  MyChart is used to connect with patients for Virtual Visits (Telemedicine).  Patients are able to view lab/test results, encounter notes, upcoming appointments, etc.  Non-urgent messages can be sent to your provider as well.   To learn more about what you can do with MyChart, go to [email or web address].    Your next appointment:   Mid-March  The format for your next appointment:   In Person  Provider:   Bryan Lemma, MD         Important Information About Sugar

## 2022-03-22 NOTE — Progress Notes (Signed)
Primary Care Provider: Maryella Shivers (Inactive) [facility] Cardiologist: Bryan Lemma, MD Electrophysiologist: None  Clinic Note: Chief Complaint  Patient presents with   Follow-up    Follow up post Stemi. Patient states that his blood pressure is elevated during the day. Patient states that his ears are ringing. Patient states that he gets winded easily. Meds    Coronary Artery Disease    No chest pain, just easy dyspnea on exertion   ===================================  ASSESSMENT/PLAN   Problem List Items Addressed This Visit       Cardiology Problems   Cardiac arrest with successful resuscitation [facility]) - Primary    In the setting of inferior STEMI, he had VT and route to the hospital by EMS.  Had shock x 1 restoring sinus rhythm.  He had a prolonged AIVR process reperfusion vomiting resolved.  Currently right dominant system.      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  He does have exertional dyspnea but only in the morning.  Somewhat unusual.  Will make some adjustments to his medications as this could be related to his elevated blood pressures.  INCREASE Losartan to 25 mg once daily at night START Chlorthalidone 12.5 mg once daily in the morning TAKE Toprol 25 mg at night Continue atorvastatin 80 mg daily-reassess labs in 3 months Smoking cessation counseling -> he has done well.  Down to 5 to 6 cigarettes a day for 1-1/2 PPD.Marland Kitchen  Continue to encourage cessation.  At this point he may benefit from NicoDerm patch.. Continue Brilinta 90 mg twice daily along with aspirin 81 mg daily.  (DAPT) Uninterrupted DAPT x 1 year.   Lab  Work: CBC, BMP in 2 weeks. Lipid and CMP panel at 28-month follow-up      Relevant Medications   losartan (COZAAR) 25 MG tablet   chlorthalidone (HYGROTON) 25 MG tablet   metoprolol succinate (TOPROL XL) 25 MG 24 hr tablet   Other Relevant Orders   EKG 12-Lead   CBC   Basic metabolic panel   Hyperlipidemia with target LDL less than 70 (Chronic)    In the hospital LDL was 142 with TC of 218.  Elevated LP(a) as well started on high-dose atorvastatin 80 mg daily which she seems to be doing pretty well with.  Reassess labs at 33-month follow-up appointment..  Low threshold to consider escalation of therapy if not able to achieve goal, especially in light of LP(a) be elevated-may need PCSK9 inhibitor.       Relevant Medications   losartan (COZAAR) 25 MG tablet   chlorthalidone (HYGROTON) 25 MG tablet   metoprolol succinate (TOPROL XL) 25 MG 24 hr tablet   Other Relevant Orders   CBC   Basic metabolic panel   Essential hypertension (Chronic)    BP too high.  I suspect this may be affecting his breathing in the morning.  Likely still diastolic dysfunction.  Plan: INCREASE Losartan to 25 mg once daily at night START Chlorthalidone 12.5 mg once daily in the morning TAKE Toprol 25 mg at night (suppertime)  Lab Work: CBC, BMP in 2 weeks.  Relevant Medications   losartan (COZAAR) 25 MG tablet   chlorthalidone (HYGROTON) 25 MG tablet   metoprolol succinate (TOPROL XL) 25 MG 24 hr tablet   Acute ST elevation myocardial infarction (STEMI) of inferior wall (HCC)   Relevant Medications   losartan (COZAAR) 25 MG tablet   chlorthalidone (HYGROTON) 25 MG tablet   metoprolol succinate (TOPROL XL) 25 MG 24 hr tablet   Other Relevant Orders   EKG 12-Lead   CBC   Basic metabolic panel     Other   Presence of drug coated stent in right coronary artery (Chronic)    RCA DES PCI in setting of Inferior STEMI : Currently on ASA 81 mg/Brilinta 90 mg twice daily DAPT.  Will plan  uninterrupted DAPT for minimum 1 year.  (03/09/2023) -> at that point, would discontinue aspirin and continue Brilinta but at reduced dose (60 mg twice daily) to complete year 2.  At that point, would be okay to hold Brilinta 5 to 7 days for procedures or surgeries.      Relevant Orders   CBC   Basic metabolic panel   Current smoker (Chronic)    Use NicoDerm patch in the hospital, and successfully cut himself down to 5 to 6 cigarettes a day from 1 and half PPD. => At this point he may benefit from NicoDerm patch again.  He wants to try doing it without it.  His progression will be discussed in follow-up.      Relevant Orders   CBC   Basic metabolic panel    ===================================  HPI:    Christopher Rosario is a 61 y.o. male long-term smoker with a PMH notable for HTN and HLD Who Recently Suffered an Inferior STEMI on 03/08/2022 (out-of-hospital arrest-VF with defib shock x 1) who presents today for Post hospital follow-up.  Recent Hospitalizations:  12/7-12/2021: Admitted for inferior STEMI-RCA PCI:   Christopher Rosario was last seen prior to discharge on 03/10/2022 by Eula Listenyan Dunn, PA and Christopher SailorsKemper Neibert, MD..  Was doing well with no major complaints.  Okay for discharge.  Reviewed  CV studies:    The following studies were reviewed today: (if available, images/films reviewed: From Epic Chart or Care Everywhere) LHC-PCI (Inf STEMI) 03/08/2022:severe Single Vesel CAD - 100%  m-dRCA=>  dRCA Successful PTCA =-> DES PCI using Onyx Frontier DES 2.5 x 26 mm postdilated to 3.1 mm. - restored TIMI 3 flow with lesion reduced to 0% Post PTCA reperfusion - prolonged AIVR episodes with hypotension--> short run of Levophed ~ 4 mcg/min, stopped post PCI Relatively normal left coronary System. Relatively preserved LV function with apical inferior hypokinesis. EF roughly 45 to 50%. Normal EDP  Diagnostic  Dominance: Right     Intervention    TTE 03/09/2022: (Inf STEMI - RCA PCI) -EF 55%.  Low normal.   Inferior and posterior wall HK.  GR 2 DD.  Normal RV.  Trivial MR, trivial AI.  Otherwise normal valves.  Normal RV function with normal RAP and RVP.  Interval History:   Christopher Rosario returns today for his very first post hospital follow-up.  He says his blood pressures do not allow higher than usually are, but he does not always mention that at home.  He says that he has not had any more chest pain that he was having at the time of his MI, but has a strange issue with having high blood pressures in the morning through which time he feels more short of breath than usual.  By the end of the day he is feeling fine and is able to be out about doing things but in the morning he is short of breath.  He denies any PND, orthopnea or edema.  He says his nighttime his blood pressures are fine, and he feels the best he has in a while.Marland Kitchen  He has not had any issues at all with rapid irregular heartbeats palpitations.  No syncope or near syncope, no TIA or amaurosis fugax.  No claudication.  No melena, hematochezia hematuria, epistaxis, or significant bruising or bleeding.Marland Kitchen  He also has problems that he is cut down about 5 or 6 cigarettes a day from has been about 1-1/2 packs a day for over 30 years.  He asked about when he can get back to work.  REVIEWED OF SYSTEMS   Review of Systems  Constitutional:  Positive for malaise/fatigue (Not as vigorous mostly in the morning.). Negative for chills, fever and weight loss.  HENT:  Negative for congestion.   Respiratory:  Positive for cough and shortness of breath (Mostly in the morning). Negative for hemoptysis.   Gastrointestinal:  Negative for abdominal pain, constipation and diarrhea.  Musculoskeletal:  Negative for joint pain.  Neurological:  Positive for dizziness (Some positional). Negative for focal weakness and weakness.  Psychiatric/Behavioral: Negative.    All other systems reviewed and are negative.   I have reviewed and (if needed) personally updated the  patient's problem list, medications, allergies, past medical and surgical history, social and family history.   PAST MEDICAL HISTORY   Past Medical History:  Diagnosis Date   Cardiac arrest with successful resuscitation (HCC) 03/08/2022   in setting of Inferior STEMI   Coronary artery disease involving native coronary artery of native heart with angina pectoris (HCC) 03/22/2022   LHC-PCI (Inf STEMI) 03/08/2022:severe Single Vesel CAD - 100%  m-dRCA=>  Successful PTCA =-> DES PCI using Onyx Frontier DES 2.5 x 26 mm postdilated to 3.1 mm. - restored TIMI 3 flow with lesion reduced to 0%   Essential hypertension 02/24/2018   Hyperlipidemia with target LDL less than 70 03/08/2022   ST elevation myocardial infarction (STEMI) of inferior wall (HCC) 03/08/2022   mid-distal RCA 100% -> DES PCI.    PAST SURGICAL HISTORY   Past Surgical History:  Procedure Laterality Date   CORONARY/GRAFT ACUTE MI REVASCULARIZATION N/A 03/08/2022   Procedure: Coronary/Graft Acute MI Revascularization;  Surgeon: Marykay Lex, MD;  Location: [facility] INVASIVE CV LAB;  Service: CV;  INF STEMI (OOH VT-DEfib x 1) - 100% m-d RCA: PTCA-DES PCI Onyx Frontier 2.5 x 26 -> 3.1 mm. (Prolonged AIVR post PTCA (reduced to 0%, TIMI 3 flow restored)associated with Hypotension -> Brief Course of IV Levophed.   LEFT HEART CATH AND CORONARY ANGIOGRAPHY N/A 03/08/2022   Procedure: LEFT HEART CATH AND CORONARY ANGIOGRAPHY;  Surgeon: Marykay Lex, MD;  Location: [facility] INVASIVE CV LAB;  Service: CV: (Inf STEMI - OOH VT- Defib x 1): CULPRIT m-d RCA 100% (DES PCI); minimal to mild LCA  disease.   NO PAST SURGERIES     TRANSTHORACIC ECHOCARDIOGRAM  03/09/2022   [facility]: (Inf STEMI - RCA PCI) -EF 55%.  Low normal.  Inferior and posterior wall HK.  GR 2 DD.  Normal RV.  Trivial MR, trivial AI.  Otherwise normal valves.  Normal RV function with normal RAP and RVP.    Immunization History  Administered Date(s) Administered   Tdap 04/07/2018     MEDICATIONS/ALLERGIES   Current Meds  Medication Sig   aspirin 81 MG chewable tablet Chew 1 tablet (81 mg total) by mouth daily.   atorvastatin (LIPITOR) 80 MG tablet Take 1 tablet (80 mg total) by mouth every evening.   folic acid (FOLVITE) 1 MG tablet Take 1 tablet (1 mg total) by mouth daily.   losartan (COZAAR) 25 MG tablet Take 0.5 tablets (12.5 mg total) by mouth daily.   metoprolol succinate (TOPROL XL) 25 MG 24 hr tablet Take 1 tablet (25 mg total) by mouth at bedtime.   nicotine (NICODERM CQ - DOSED IN MG/24 HOURS) 21 mg/24hr patch One patch chest wall daily (okay to substitute generic)   thiamine (VITAMIN B-1) 100 MG tablet Take 1 tablet (100 mg total) by mouth daily.   thiamine (VITAMIN B1) 100 MG tablet Take 100 mg by mouth daily.   ticagrelor (BRILINTA) 90 MG TABS tablet Take 1 tablet (90 mg total) by mouth 2 (two) times daily.    No Known Allergies  SOCIAL HISTORY/FAMILY HISTORY   Reviewed in Epic:  Pertinent findings:  Social History   Tobacco Use   Smoking status: Every Day    Packs/day: 0.25    Years: 35.00    Total pack years: 8.75    Types: Cigarettes   Smokeless tobacco: Never   Tobacco comments:    He says that he has cut down to 5 to 6 cigarettes a day from 1-1/2 packs a day, but has not been on it.  Vaping Use   Vaping Use: Never used  Substance Use Topics   Alcohol use: Yes    Alcohol/week: 7.0 standard drinks of alcohol    Types: 7 Cans of beer per week   Drug use: No   Social History   Social History Narrative   Not on file    OBJCTIVE -PE, EKG, labs   Wt Readings from Last 3 Encounters:  03/22/22 154 lb 9.6 oz (70.1 kg)  03/08/22 134 lb 14.7 oz (61.2 kg)  12/17/18 159 lb (72.1 kg)    Physical Exam: Physical Exam Vitals reviewed.  Constitutional:      General: He is not in acute distress.    Appearance: Normal appearance. He is not ill-appearing (Well-nourished, well-groomed.) or toxic-appearing.     Comments: Thick beard  makes neck examination difficult.  Smells of cigarette smoke  HENT:     Head: Normocephalic and atraumatic.  Neck:     Vascular: No carotid bruit or JVD (Difficult to assess due to beard).  Cardiovascular:     Rate and Rhythm: Normal rate and regular rhythm. No extrasystoles are present.    Chest Wall: PMI is not displaced.     Pulses: Intact distal pulses. Decreased pulses (Mildly decreased pedal pulses).     Heart sounds: S1 normal and S2 normal.     No friction rub. No gallop.  Pulmonary:     Effort: Pulmonary effort is normal. No respiratory distress.     Comments: Mild diffuse crackles but no obvious wheezes, rales or rhonchi. Musculoskeletal:        General: No swelling. Normal range of motion.     Cervical back: Normal range of motion and neck supple.  Skin:    General: Skin is warm and dry.  Neurological:     General: No focal deficit present.     Mental Status: He is alert and oriented to person, place, and time.     Gait: Gait normal.  Psychiatric:        Mood  and Affect: Mood normal.        Behavior: Behavior normal.        Thought Content: Thought content normal.        Judgment: Judgment normal.      Adult ECG Report  Rate: 64;  Rhythm: normal sinus rhythm and deep inverted T waves in inferior leads, findings are consistent with involutional changes of inferior MI. ;   Narrative Interpretation: Unfortunately, no post PCI EKG ordered.  Recent Labs: Reviewed Lab Results  Component Value Date   CHOL 218 (H) 03/09/2022   HDL 51 03/09/2022   LDLCALC 142 (H) 03/09/2022   TRIG 127 03/09/2022   CHOLHDL 4.3 03/09/2022       Component Ref Range & Units 03/09/2022  Lipoprotein (a) <75.0 nmol/L 189.6 High       Lab Results  Component Value Date   CREATININE 0.67 03/09/2022   BUN 10 03/09/2022   NA 139 03/09/2022   K 3.9 03/09/2022   CL 111 03/09/2022   CO2 23 03/09/2022      Latest Ref Rng & Units 03/09/2022    4:50 AM 03/08/2022    9:44 AM 03/08/2022     8:56 AM  CBC  WBC 4.0 - 10.5 K/uL 8.2   12.2   Hemoglobin 13.0 - 17.0 g/dL 21.3  08.6  57.8   Hematocrit 39.0 - 52.0 % 43.2  41.0  46.2   Platelets 150 - 400 K/uL 204   225     Lab Results  Component Value Date   HGBA1C 5.3 03/08/2022   No results found for: "TSH"  ================================================== I spent a total of 21 minutes with the patient spent in direct patient consultation.  Additional time spent with chart review  / charting (studies, outside notes, etc): 23 min => Reviewed cardiac cath, echo results and rounding notes. Total Time: 44 min  Current medicines are reviewed at length with the patient today.  (+/- concerns) N/A  Notice: This dictation was prepared with Dragon dictation along with smart phrase technology. Any transcriptional errors that result from this process are unintentional and may not be corrected upon review.  Studies Ordered:  Orders Placed This Encounter  Procedures   CBC   Basic metabolic panel   EKG 12-Lead   Meds ordered this encounter  Medications   losartan (COZAAR) 25 MG tablet    Sig: Take 1 tablet (25 mg total) by mouth daily. Daily at night    Dispense:  90 tablet    Refill:  3   chlorthalidone (HYGROTON) 25 MG tablet    Sig: Take 0.5 tablets (12.5 mg total) by mouth daily. Daily in the morning    Dispense:  45 tablet    Refill:  3   metoprolol succinate (TOPROL XL) 25 MG 24 hr tablet    Sig: Take 1 tablet (25 mg total) by mouth at bedtime. Daily at night    Dispense:  30 tablet    Refill:  0    Patient Instructions / Medication Changes & Studies & Tests Ordered   Patient Instructions  Medication Instructions:  Your physician has recommended you make the following change in your medication:   INCREASE Losartan to 25 mg once daily at night START Chlorthalidone 12.5 mg once daily in the morning TAKE Metoprolol at night   *If you need a refill on your cardiac medications before your next appointment, please  call your pharmacy*   Lab Work: CBC, BMP in 2 weeks. No appointment  is needed and please go to the following location: Medical Mall Entrance at [facility] 1st desk on the right to check in (REGISTRATION)  Lab hours: Monday- Friday (7:30 am- 5:30 pm)  If you have labs (blood work) drawn today and your tests are completely normal, you will receive your results only by: MyChart Message (if you have MyChart) OR A paper copy in the mail If you have any lab test that is abnormal or we need to change your treatment, we will call you to review the results.   Testing/Procedures: None   Follow-Up: At [facility], you and your health needs are our priority.  As part of our continuing mission to provide you with exceptional heart care, we have created designated Provider Care Teams.  These Care Teams include your primary Cardiologist (physician) and Advanced Practice Providers (APPs -  Physician Assistants and Nurse Practitioners) who all work together to provide you with the care you need, when you need it.  We recommend signing up for the patient portal called "MyChart".  Sign up information is provided on this After Visit Summary.  MyChart is used to connect with patients for Virtual Visits (Telemedicine).  Patients are able to view lab/test results, encounter notes, upcoming appointments, etc.  Non-urgent messages can be sent to your provider as well.   To learn more about what you can do with MyChart, go to [email or web address].    Your next appointment:   Mid-March  The format for your next appointment:   In Person  Provider:   Bryan Lemma, MD        Important Information About Sugar           Marykay Lex, MD, MS Bryan Lemma, M.D., M.S. Interventional Cardiologist  [facility]   [address] [place], [place]  [address] [phone]           Fax [phone]    Thank you for choosing [facility] in [place]!!

## 2022-03-24 ENCOUNTER — Encounter: Payer: Self-pay | Admitting: Cardiology

## 2022-03-24 NOTE — Assessment & Plan Note (Signed)
RCA DES PCI in setting of Inferior STEMI : Currently on ASA 81 mg/Brilinta 90 mg twice daily DAPT.  Will plan uninterrupted DAPT for minimum 1 year.  (03/09/2023) -> at that point, would discontinue aspirin and continue Brilinta but at reduced dose (60 mg twice daily) to complete year 2.  At that point, would be okay to hold Brilinta 5 to 7 days for procedures or surgeries.

## 2022-03-24 NOTE — Assessment & Plan Note (Signed)
In the hospital LDL was 142 with TC of 218.  Elevated LP(a) as well started on high-dose atorvastatin 80 mg daily which she seems to be doing pretty well with.  Reassess labs at 32-month follow-up appointment..  Low threshold to consider escalation of therapy if not able to achieve goal, especially in light of LP(a) be elevated-may need PCSK9 inhibitor.

## 2022-03-24 NOTE — Assessment & Plan Note (Signed)
BP too high.  I suspect this may be affecting his breathing in the morning.  Likely still diastolic dysfunction.  Plan: INCREASE Losartan to 25 mg once daily at night START Chlorthalidone 12.5 mg once daily in the morning TAKE Toprol 25 mg at night (suppertime)  Lab Work: CBC, BMP in 2 weeks.

## 2022-03-24 NOTE — Assessment & Plan Note (Addendum)
No longer having any angina.  He does have exertional dyspnea but only in the morning.  Somewhat unusual.  Will make some adjustments to his medications as this could be related to his elevated blood pressures.  INCREASE Losartan to 25 mg once daily at night START Chlorthalidone 12.5 mg once daily in the morning TAKE Toprol 25 mg at night Continue atorvastatin 80 mg daily-reassess labs in 3 months Smoking cessation counseling -> he has done well.  Down to 5 to 6 cigarettes a day for 1-1/2 PPD.Marland Kitchen  Continue to encourage cessation.  At this point he may benefit from NicoDerm patch.. Continue Brilinta 90 mg twice daily along with aspirin 81 mg daily.  (DAPT) Uninterrupted DAPT x 1 year.   Lab Work: CBC, BMP in 2 weeks. Lipid and CMP panel at 60-month follow-up

## 2022-03-24 NOTE — Assessment & Plan Note (Signed)
In the setting of inferior STEMI, he had VT and route to the hospital by EMS.  Had shock x 1 restoring sinus rhythm.  He had a prolonged AIVR process reperfusion vomiting resolved.  Currently right dominant system.

## 2022-03-24 NOTE — Assessment & Plan Note (Signed)
Use NicoDerm patch in the hospital, and successfully cut himself down to 5 to 6 cigarettes a day from 1 and half PPD. => At this point he may benefit from NicoDerm patch again.  He wants to try doing it without it.  His progression will be discussed in follow-up.

## 2022-03-30 ENCOUNTER — Telehealth: Payer: Self-pay | Admitting: Cardiology

## 2022-03-30 MED ORDER — METOPROLOL SUCCINATE ER 25 MG PO TB24: 25.0000 mg | ORAL_TABLET | Freq: Every day | ORAL | 0 refills | Status: DC

## 2022-03-30 NOTE — Telephone Encounter (Signed)
Patient was last seen on the 21st and changes were made to medications. Pt states that "meds are not working" and is experiencing high bp, low hr and sob.Patient is in lobby at this time.Pt is also out of metoprolol and needs refill.

## 2022-03-30 NOTE — Telephone Encounter (Signed)
Spoke with patient and he reports feeling terrible for the past couple of weeks. He reports slow heart rates in the 60's and I reviewed that was in normal range. He then states he stops breathing  in the night. Inquired if he sees or has seen a pulmonary doctor and he said no. Blood pressures have been 200/130 last Friday, and 155/92. Some of these readings are before medications but some are after. Systolic blood pressures have been consistently elevated. He did not have any other readings available. He states that none of this is working for him and he needs some help. Patient reports having a distinct feeling when his blood pressures are elevated. Patient states he wants some help to get this under control. Advised that I would send this message over to his provider and we will call back with his recommendations. Strict ED precautions reviewed with patient. He verbalized understanding of our conversation with no further questions at this time.

## 2022-04-02 NOTE — Telephone Encounter (Signed)
Lets have him increase Losartan to 50 mg (2 tabs). Also increase to full tab Chlorthalidone 25 mg. -- take together with Losartan In AM & take Toprol in PM,.   HR in 60s is intentional with Metoprolol    Glenetta Hew, MD

## 2022-04-03 MED ORDER — CHLORTHALIDONE 25 MG PO TABS: 25.0000 mg | ORAL_TABLET | Freq: Every day | ORAL | 3 refills | Status: DC

## 2022-04-03 NOTE — Telephone Encounter (Signed)
Spoke with patient to review providers recommendations. He then wanted to talk about his blood pressures and medications. He continued to take his medications but his blood pressures were very elevated. Patient decided to stop his medications and blood pressures were around 137/92. He then started taking 1/4 of losartan & 1/4 of Toprol and since then his readings have been normal.   137/92 120/80 145/90 140/90 Over the weekend as follows:  120/80 122/95 126/88 121/81 111/86 122/95  Since cutting back on his medications he has been feeling so much better and for the first time in weeks he has had wonderful sleep. He really prefers to stay on these dosages he is currently taking. He was agreeable to increase the chlorthalidone to whole tablet once daily at bedtime. As far as the other medications he can continue his current plan and we will wait to see if there are any further changes or recommendations. He was agreeable with plan.

## 2022-04-04 NOTE — Telephone Encounter (Signed)
Would prefer that he take 1/2 tab of Toprol - otherwise stay the same as long as BP are stable.    Glenetta Hew, MD

## 2022-04-05 NOTE — Telephone Encounter (Signed)
Attempted to call the patient with further MD recommendations. No answer- unable to leave a voice mail as the mail box is full.   Will call back at a later time.

## 2022-04-09 ENCOUNTER — Encounter: Payer: Self-pay | Admitting: *Deleted

## 2022-04-09 NOTE — Telephone Encounter (Signed)
Left a message for the patient to call back.  

## 2022-04-09 NOTE — Telephone Encounter (Signed)
Sounds good DH 

## 2022-04-09 NOTE — Telephone Encounter (Signed)
Attempted to call the patient. His phone rang once and then went to voice mail.  I left a message to please call back.

## 2022-04-09 NOTE — Telephone Encounter (Signed)
The patient presented to the office to discuss his RTW note and medications.  He was given a note from Dr. Ellyn Hack on 03/22/22 stating: To Whom It May Concern:  It is my medical opinion that Christopher Rosario may return to limited participation on 03/29/2022 with the following restrictions: light duty for 2 weeks and no lifting more than 20 pounds.     The patient advised that he actually kept himself out of work due to his elevated blood pressures and not feeling well. He attempted to RTW today with the note we provided to him on 03/22/22, but his employer would not except this stating they needed a note dated for today.  I have provided the patient with an updated note stating he can RTW immediately with light duty, no lifting more than 20 lbs. He may resume full duty with no restrictions on Friday 04/13/22.  The patient voices understanding and is agreeable.  I have advised the patient that Dr. Ellyn Hack was wanting him to take at least 1/2 (12.5 mg) once daily of his metoprolol succinate. Per the patient, he is currently taking: - Metoprolol succinate 25 mg: 1/2 tablet (12.5 mg) TID - Losartan 25 mg- 1/4 tablet (6.25 mg) by mouth BID  The patient states his SBP has been consistently 120 throughout the day on this dosing of metoprolol and losartan. He is also sleeping better at night.  I have confirmed all other medications are correct in his chart.  I have advised the patient to please call us if he feels this dosing of medications is not working for him so we can keep his list updated.

## 2022-04-09 NOTE — Telephone Encounter (Signed)
Patient came by office and requested phone call due to not going to work- needs a note.

## 2022-04-09 NOTE — Telephone Encounter (Signed)
Patient in lobby States that he was released to go back to work for light duty on 12/28 but did not go back until today States that they will not let him back without a new note saying that he is ok to go back now  Please advise

## 2022-04-09 NOTE — Telephone Encounter (Signed)
Patient returning call.

## 2022-04-10 ENCOUNTER — Encounter: Payer: Self-pay | Admitting: Cardiology

## 2022-04-10 MED ORDER — TICAGRELOR 90 MG PO TABS
ORAL_TABLET | ORAL | Status: AC | PRN
  Administered 2022-03-08: 180 mg via ORAL

## 2022-05-26 ENCOUNTER — Other Ambulatory Visit: Payer: Self-pay | Admitting: Cardiology

## 2022-06-13 NOTE — Progress Notes (Unsigned)
Cardiology Office Note    Date:  06/14/2022   ID:  Christopher Rosario, DOB 11-05-1960, MRN [id number]  PCP:  Trinna Post, PA-C (Inactive)  Cardiologist:  Glenetta Hew, MD  Electrophysiologist:  None   Chief Complaint: Follow up  History of Present Illness:   Christopher Rosario is a 62 y.o. male with history of CAD with inferior STEMI in [id number] complicated by out of hospital ventricular fibrillation arrest status post defibrillation x 1 and PCI/DES to the RCA, HTN, HLD, and tobacco use who presents for follow-up of his CAD.  He was admitted to the hospital in 03/2022 with an out of hospital VF arrest status post shock x 1 in the setting of inferior ST elevation MI status post PCI/DES to the mid RCA.  There was residual 20% stenosis in the distal RCA.  LVEF 45 to 50% by visual estimate on LV gram.  Echo post procedure demonstrated an EF of 50 to 55%, inferior/posterior wall hypokinesis, grade 1 diastolic dysfunction, normal RV systolic function and ventricular cavity size, trivial mitral regurgitation, trivial aortic insufficiency, and an estimated right atrial pressure of 3 mmHg.  He was seen in hospital follow-up on 03/22/2022 and was without symptoms of angina.  He did note some elevated BP readings and shortness of breath in the mornings that typically improved later throughout the day.  He had cut back on his tobacco use to 5 to 6 cigarettes/day (previously 1.5 packs/day for over 30 years).  Losartan was increased to 25 mg at night with the initiation of chlorthalidone 12.5 mg in the morning.  He was also advised to take Toprol at night.  He comes in doing well from a cardiac perspective.  He is without symptoms of angina or cardiac decompensation.  Unfortunately, he has been out of Brilinta and atorvastatin since early January 2024.  He did not contact our office for refills.  He has been out of chlorthalidone for several weeks as well.  Fortunately, he has been without symptoms of chest pain/angina  or dyspnea.  He does continue to note some intermittent randomly occurring abdominal discomfort that is longstanding and predates his MI.  He reports he has self decreased his losartan and metoprolol as he indicates that these medications were elevating his blood pressure rather than decreasing it.  He reports when he self discontinued these 2 medications for several days his blood pressure improved from the [id number] systolic to [id number] systolic.  Currently, he is taking a fourth of a 25 mg of losartan and Toprol-XL 12.5 mg twice daily and reports his blood pressure has ranged between the [id number] to 0000000 systolic.  He continues to smoke approximately 1/2 pack of cigarettes per day and has found it difficult to quit secondary to oral fixation and taste of tobacco.   Labs independently reviewed: 03/2022 - TC 218, TG 127, HDL 51, LDL 142, Hgb 14.8, PLT 204, potassium 3.9, BUN 10, serum creatinine 0.67, magnesium 2.2, albumin 3.7, AST/ALT normal, A1c 5.3  Past Medical History:  Diagnosis Date   Cardiac arrest with successful resuscitation ([place]) 03/08/2022   in setting of Inferior STEMI   Coronary artery disease involving native coronary artery of native heart with angina pectoris ([place]) 03/22/2022   LHC-PCI (Inf STEMI) 03/08/2022:severe Single Vesel CAD - 100%  m-dRCA=>  Successful PTCA =-> DES PCI using Onyx Frontier DES 2.5 x 26 mm postdilated to 3.1 mm. - restored TIMI 3 flow with lesion reduced to 0%   Essential hypertension 02/24/2018  Hyperlipidemia with target LDL less than 70 03/08/2022   ST elevation myocardial infarction (STEMI) of inferior wall ([place]) 03/08/2022   mid-distal RCA 100% -> DES PCI.    Past Surgical History:  Procedure Laterality Date   CORONARY/GRAFT ACUTE MI REVASCULARIZATION N/A 03/08/2022   Procedure: Coronary/Graft Acute MI Revascularization;  Surgeon: Leonie Man, MD;  Location: [place] [facility];  Service: Cardiovascular;  Laterality: N/A;   LEFT HEART CATH AND CORONARY  ANGIOGRAPHY N/A 03/08/2022   Procedure: LEFT HEART CATH AND CORONARY ANGIOGRAPHY;  Surgeon: Leonie Man, MD;  Location: [place] [facility];  Service: Cardiovascular;  Laterality: N/A;   NO PAST SURGERIES     TRANSTHORACIC ECHOCARDIOGRAM  03/09/2022   [facility]: (Inf STEMI - RCA PCI) -EF 55%.  Low normal.  Inferior and posterior wall HK.  GR 2 DD.  Normal RV.  Trivial MR, trivial AI.  Otherwise normal valves.  Normal RV function with normal RAP and RVP.    Current Medications: Current Meds  Medication Sig   aspirin 81 MG chewable tablet Chew 1 tablet (81 mg total) by mouth daily.   metoprolol succinate (TOPROL-XL) 25 MG 24 hr tablet TAKE 1 TABLET (25 MG TOTAL) BY MOUTH AT BEDTIME. DAILY AT NIGHT   ticagrelor (BRILINTA) 90 MG TABS tablet Take 1 tablet (90 mg total) by mouth 2 (two) times daily.   [DISCONTINUED] losartan (COZAAR) 25 MG tablet Take 1/4 tablet (6.25 mg) twice daily    Allergies:   Patient has no known allergies.   Social History   Socioeconomic History   Marital status: Married    Spouse name: Not on file   Number of children: Not on file   Years of education: Not on file   Highest education level: Not on file  Occupational History   Not on file  Tobacco Use   Smoking status: Every Day    Packs/day: 0.50    Years: 35.00    Additional pack years: 0.00    Total pack years: 17.50    Types: Cigarettes   Smokeless tobacco: Never   Tobacco comments:    He says that he has cut down to 5 to 6 cigarettes a day from 1-1/2 packs a day, but has not been on it.  Vaping Use   Vaping Use: Never used  Substance and Sexual Activity   Alcohol use: Yes    Alcohol/week: 7.0 standard drinks of alcohol    Types: 7 Cans of beer per week    Comment: 7 per day   Drug use: No   Sexual activity: Not on file  Other Topics Concern   Not on file  Social History Narrative   Not on file   Social Determinants of Health   Financial Resource Strain: Not on file  Food Insecurity: Not  on file  Transportation Needs: Not on file  Physical Activity: Not on file  Stress: Not on file  Social Connections: Not on file     Family History:  The patient's family history includes Aneurysm in his father; Breast cancer in his mother; Cancer in his mother; Healthy in his sister; Hypertension in his father; Prostate cancer in his maternal uncle; Stroke in his mother.  ROS:   12-point review of systems is negative unless otherwise noted in HPI.   EKGs/Labs/Other Studies Reviewed:    Studies reviewed were summarized above. The additional studies were reviewed today:  2D echo 03/08/2022: 1. Left ventricular ejection fraction, by estimation, is 50 to 55%.  The  left ventricle has low normal function. The left ventricle demonstrates  regional wall motion abnormalities (Inferior/posterior wall hypokinesis).  Left ventricular diastolic  parameters are consistent with Grade I diastolic dysfunction (impaired  relaxation).   2. Right ventricular systolic function is normal. The right ventricular  size is normal.   3. The mitral valve is normal in structure. Trivial mitral valve  regurgitation. No evidence of mitral stenosis.   4. The aortic valve is normal in structure. Aortic valve regurgitation is  trivial. No aortic stenosis is present.   5. The inferior vena cava is normal in size with greater than 50%  respiratory variability, suggesting right atrial pressure of 3 mmHg. __________   LHC 03/08/2022:   CULPRIT mid RCA lesion is 100% stenosed.   A drug-eluting stent was successfully placed using a STENT ONYX FRONTIER 2.5X26.  Postdilated to 3.1 mm   Post intervention, there is a 0% residual stenosis.   Dist RCA lesion is 20% stenosed.   ----------------------------------   There is mild left ventricular systolic dysfunction. The left ventricular ejection fraction is 45-50% by visual estimate.   LV end diastolic pressure is normal.   There is no aortic valve stenosis.    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 - restored TIMI 3 flow with lesion reduced to 0% Post PTCA reperfusion - prolonged AIVR episodes with hypotension--> short run of Levophed ~ 4 mcg/min, stopped post PCI Relatively normal left coronary System. Relatively preserved LV function with apical inferior hypokinesis. EF roughly 45 to 50%. Normal EDP      RECOMMENDATIONS: Admit to [facility] with [place] and consultation Echocardiogram ordered.  Troponin cycled. FLP, LP(a) checked-started on high-dose statin Start low-dose beta-blocker Smoking cessation counseling    EKG:  EKG is not ordered today.   Recent Labs: 03/08/2022: ALT 20; Magnesium 2.2 03/09/2022: BUN 10; Creatinine, Ser 0.67; Hemoglobin 14.8; Platelets 204; Potassium 3.9; Sodium 139  Recent Lipid Panel    Component Value Date/Time   CHOL 218 (H) 03/09/2022 0450   CHOL 195 04/07/2018 1432   TRIG 127 03/09/2022 0450   HDL 51 03/09/2022 0450   HDL 59 04/07/2018 1432   CHOLHDL 4.3 03/09/2022 0450   VLDL 25 03/09/2022 0450   LDLCALC 142 (H) 03/09/2022 0450   LDLCALC 105 (H) 04/07/2018 1432    PHYSICAL EXAM:    VS:  BP (!) 144/90 (BP Location: Left Arm, Patient Position: Sitting, Cuff Size: Normal)   Pulse 68   Ht '5\' 9"'$  (1.753 m)   Wt 159 lb 6.4 oz (72.3 kg)   SpO2 97%   BMI 23.54 kg/m   BMI: Body mass index is 23.54 kg/m.  Physical Exam Vitals reviewed.  Constitutional:      Appearance: He is well-developed.  HENT:     Head: Normocephalic and atraumatic.  Eyes:     General:        Right eye: No discharge.        Left eye: No discharge.  Neck:     Vascular: No JVD.  Cardiovascular:     Rate and Rhythm: Normal rate and regular rhythm.     Heart sounds: Normal heart sounds, S1 normal and S2 normal. Heart sounds not distant. No midsystolic click and no  opening snap. No murmur heard.    No friction rub.  Pulmonary:     Effort: Pulmonary effort is normal. No respiratory distress.     Breath sounds: Normal breath sounds. No decreased breath sounds, wheezing or rales.  Chest:     Chest wall: No tenderness.  Abdominal:     General: There is no distension.  Musculoskeletal:     Cervical back: Normal range of motion.  Skin:    General: Skin is warm and dry.     Nails: There is no clubbing.  Neurological:     Mental Status: He is alert and oriented to person, place, and time.  Psychiatric:        Speech: Speech normal.        Behavior: Behavior normal.        Thought Content: Thought content normal.        Judgment: Judgment normal.     Wt Readings from Last 3 Encounters:  06/14/22 159 lb 6.4 oz (72.3 kg)  03/22/22 154 lb 9.6 oz (70.1 kg)  03/08/22 134 lb 14.7 oz (61.2 kg)     ASSESSMENT & PLAN:   CAD involving the native coronary arteries with inferior STEMI complicated by ventricular fibrillation arrest status post PCI/DES to the RCA without angina: He is doing well and is without symptoms of angina or cardiac decompensation.  Unfortunately, he has been off ticagrelor since early 04/2022.  He has continued to take aspirin 81 mg daily.  The importance of DAPT was discussed in detail.  He was given samples of ticagrelor in the office with instructions to take 180 mg Brilinta x 1 now followed by 90 mg twice daily starting on 06/15/2022.  He continue aspirin 81 mg daily and Toprol-XL 25 mg daily.  We will restart atorvastatin 80 mg daily.  He was advised to contact our office if there are any difficulties in obtaining his medications or if he has any questions regarding the duration of his medications.  HTN: Blood pressure is mildly elevated in the office today.  However, he is hesitant to titrate medications indicating the more we go up on his blood pressure pills the higher his blood pressure goes.  For now, to simplify his regimen, we will  discontinue losartan and continue him on Toprol-XL 25 mg daily.  Low-sodium diet is encouraged.  Blood pressure regimen will need to be readdressed in follow-up.  HLD: LDL 142 during hospital admission with a total cholesterol of 218 and LP(a) of 189.  He has been off atorvastatin for a little over 2 months.  Restart atorvastatin 80 mg daily.  Follow-up fasting lipid and LFT in 2 to 3 months with recommendation to escalate lipid therapy as indicated to achieve target LDL of less than 55.  Patient will likely need PCSK9 inhibitor moving forward.  Tobacco use: He has decreased tobacco use to half pack of cigarettes per day.  He does have nicotine patches which helps with nicotine cravings.  Not yet ready to fully quit.  Complete cessation is encouraged.     Disposition: F/u with Dr. Ellyn Hack or an APP in 1 month.   Medication Adjustments/Labs and Tests Ordered: Current medicines are reviewed at length with the patient today.  Concerns regarding medicines are outlined above. Medication changes, Labs and Tests ordered today are summarized above and listed in the Patient Instructions accessible in Encounters.   Signed, Christell Faith, PA-C 06/14/2022 3:50 PM     [place], [place] [address] [phone]

## 2022-06-14 ENCOUNTER — Encounter: Payer: Self-pay | Admitting: Physician Assistant

## 2022-06-14 ENCOUNTER — Ambulatory Visit: Payer: BC Managed Care – PPO | Attending: Physician Assistant | Admitting: Physician Assistant

## 2022-06-14 VITALS — BP 144/90 | HR 68 | Ht 69.0 in | Wt 159.4 lb

## 2022-06-14 DIAGNOSIS — Z955 Presence of coronary angioplasty implant and graft: Secondary | ICD-10-CM

## 2022-06-14 DIAGNOSIS — I2119 ST elevation (STEMI) myocardial infarction involving other coronary artery of inferior wall: Secondary | ICD-10-CM | POA: Diagnosis not present

## 2022-06-14 DIAGNOSIS — I25119 Atherosclerotic heart disease of native coronary artery with unspecified angina pectoris: Secondary | ICD-10-CM

## 2022-06-14 DIAGNOSIS — I469 Cardiac arrest, cause unspecified: Secondary | ICD-10-CM

## 2022-06-14 DIAGNOSIS — I10 Essential (primary) hypertension: Secondary | ICD-10-CM | POA: Diagnosis not present

## 2022-06-14 DIAGNOSIS — E785 Hyperlipidemia, unspecified: Secondary | ICD-10-CM

## 2022-06-14 DIAGNOSIS — F17200 Nicotine dependence, unspecified, uncomplicated: Secondary | ICD-10-CM

## 2022-06-14 MED ORDER — CHLORTHALIDONE 25 MG PO TABS: 25.0000 mg | ORAL_TABLET | Freq: Every day | ORAL | 3 refills | Status: DC

## 2022-06-14 MED ORDER — ATORVASTATIN CALCIUM 80 MG PO TABS: 80.0000 mg | ORAL_TABLET | Freq: Every evening | ORAL | 3 refills | Status: DC

## 2022-06-14 MED ORDER — TICAGRELOR 90 MG PO TABS: 90.0000 mg | ORAL_TABLET | Freq: Two times a day (BID) | ORAL | 2 refills | Status: AC

## 2022-06-14 MED ORDER — TICAGRELOR 90 MG PO TABS: 90.0000 mg | ORAL_TABLET | Freq: Two times a day (BID) | ORAL | 0 refills | Status: AC

## 2022-06-14 NOTE — Patient Instructions (Addendum)
Medication Instructions:  Your physician has recommended you make the following change in your medication:   Tonight TAKE 2 [place] go to 1 tablet twice daily starting tomorrow. STOP Losartan   *If you need a refill on your cardiac medications before your next appointment, please call your pharmacy*   Lab Work: None  If you have labs (blood work) drawn today and your tests are completely normal, you will receive your results only by: [place] (if you have MyChart) OR A paper copy in the mail If you have any lab test that is abnormal or we need to change your treatment, we will call you to review the results.   Testing/Procedures: None   Follow-Up: At [facility], you and your health needs are our priority.  As part of our continuing mission to provide you with exceptional heart care, we have created designated Provider Care Teams.  These Care Teams include your primary Cardiologist (physician) and Advanced Practice Providers (APPs -  Physician Assistants and Nurse Practitioners) who all work together to provide you with the care you need, when you need it.  Your next appointment:   1 month(s)  Provider:   Glenetta Hew, MD or Christell Faith, PA-C

## 2022-07-17 ENCOUNTER — Ambulatory Visit: Payer: MEDICAID | Attending: Physician Assistant | Admitting: Physician Assistant

## 2022-07-17 NOTE — Progress Notes (Deleted)
Cardiology Office Note    Date:  07/17/2022   ID:  Christopher Rosario, DOB [birth year], MRN [id number]  PCP:  Trey Sailors, PA-C (Inactive)  Cardiologist:  Bryan Lemma, MD  Electrophysiologist:  None   Chief Complaint: Follow-up  History of Present Illness:   Christopher Rosario is a 62 y.o. male with history of CAD with inferior STEMI in 03/2022 complicated by out of hospital ventricular fibrillation arrest status post defibrillation x 1 and PCI/DES to the RCA, HTN, HLD, and tobacco use who presents for follow-up of his CAD.   He was admitted to the hospital in 03/2022 with an out of hospital VF arrest status post shock x 1 in the setting of inferior ST elevation MI status post PCI/DES to the mid RCA.  There was residual 20% stenosis in the distal RCA.  LVEF 45 to 50% by visual estimate on LV gram.  Echo post procedure demonstrated an EF of 50 to 55%, inferior/posterior wall hypokinesis, grade 1 diastolic dysfunction, normal RV systolic function and ventricular cavity size, trivial mitral regurgitation, trivial aortic insufficiency, and an estimated right atrial pressure of 3 mmHg.   He was seen in hospital follow-up on 03/22/2022 and was without symptoms of angina.  He did note some elevated BP readings and shortness of breath in the mornings that typically improved later throughout the day.  He had cut back on his tobacco use to 5 to 6 cigarettes/day (previously 1.5 packs/day for over 30 years).  Losartan was increased to 25 mg at night with the initiation of chlorthalidone 12.5 mg in the morning.  He was also advised to take Toprol at night.  He was last seen in the office in 06/2022 and was without symptoms of angina or cardiac decompensation.  He had been out of Brilinta and atorvastatin since early 04/2022.  He had been out of chlorthalidone for several weeks as well.  He did not contact our office for refills.  He reported self decreasing losartan and metoprolol in the setting of self-reported  elevated blood pressure readings with titrated doses.  On lower doses of medications, he reported improved BP readings.  He continues to smoke 1/2 pack of cigarettes per day.  He was reinitiated on ticagrelor with loading dose along with atorvastatin.  He preferred to keep losartan and Toprol-XL at lower dose.  ***   Labs independently reviewed: 03/2022 - TC 218, TG 127, HDL 51, LDL 142, Hgb 14.8, PLT 204, potassium 3.9, BUN 10, serum creatinine 0.67, magnesium 2.2, albumin 3.7, AST/ALT normal, A1c 5.3   Past Medical History:  Diagnosis Date   Cardiac arrest with successful resuscitation (HCC) 03/08/2022   in setting of Inferior STEMI   Coronary artery disease involving native coronary artery of native heart with angina pectoris (HCC) 03/22/2022   LHC-PCI (Inf STEMI) 03/08/2022:severe Single Vesel CAD - 100%  m-dRCA=>  Successful PTCA =-> DES PCI using Onyx Frontier DES 2.5 x 26 mm postdilated to 3.1 mm. - restored TIMI 3 flow with lesion reduced to 0%   Essential hypertension 02/24/2018   Hyperlipidemia with target LDL less than 70 03/08/2022   ST elevation myocardial infarction (STEMI) of inferior wall (HCC) 03/08/2022   mid-distal RCA 100% -> DES PCI.    Past Surgical History:  Procedure Laterality Date   CORONARY/GRAFT ACUTE MI REVASCULARIZATION N/A 03/08/2022   Procedure: Coronary/Graft Acute MI Revascularization;  Surgeon: Marykay Lex, MD;  Location: [facility] INVASIVE CV LAB;  Service: Cardiovascular;  Laterality: N/A;   LEFT  HEART CATH AND CORONARY ANGIOGRAPHY N/A 03/08/2022   Procedure: LEFT HEART CATH AND CORONARY ANGIOGRAPHY;  Surgeon: Marykay Lex, MD;  Location: [facility] INVASIVE CV LAB;  Service: Cardiovascular;  Laterality: N/A;   NO PAST SURGERIES     TRANSTHORACIC ECHOCARDIOGRAM  03/09/2022   [facility]: (Inf STEMI - RCA PCI) -EF 55%.  Low normal.  Inferior and posterior wall HK.  GR 2 DD.  Normal RV.  Trivial MR, trivial AI.  Otherwise normal valves.  Normal RV function with  normal RAP and RVP.    Current Medications: No outpatient medications have been marked as taking for the 07/17/22 encounter (Appointment) with Sondra Barges, PA-C.    Allergies:   Patient has no known allergies.   Social History   Socioeconomic History   Marital status: Married    Spouse name: Not on file   Number of children: Not on file   Years of education: Not on file   Highest education level: Not on file  Occupational History   Not on file  Tobacco Use   Smoking status: Every Day    Packs/day: 0.50    Years: 35.00    Additional pack years: 0.00    Total pack years: 17.50    Types: Cigarettes   Smokeless tobacco: Never   Tobacco comments:    He says that he has cut down to 5 to 6 cigarettes a day from 1-1/2 packs a day, but has not been on it.  Vaping Use   Vaping Use: Never used  Substance and Sexual Activity   Alcohol use: Yes    Alcohol/week: 7.0 standard drinks of alcohol    Types: 7 Cans of beer per week    Comment: 7 per day   Drug use: No   Sexual activity: Not on file  Other Topics Concern   Not on file  Social History Narrative   Not on file   Social Determinants of Health   Financial Resource Strain: Not on file  Food Insecurity: Not on file  Transportation Needs: Not on file  Physical Activity: Not on file  Stress: Not on file  Social Connections: Not on file     Family History:  The patient's family history includes Aneurysm in his father; Breast cancer in his mother; Cancer in his mother; Healthy in his sister; Hypertension in his father; Prostate cancer in his maternal uncle; Stroke in his mother.  ROS:   12-point review of systems is negative unless otherwise noted in the HPI.   EKGs/Labs/Other Studies Reviewed:    Studies reviewed were summarized above. The additional studies were reviewed today:  2D echo 03/08/2022: 1. Left ventricular ejection fraction, by estimation, is 50 to 55%. The  left ventricle has low normal function. The  left ventricle demonstrates  regional wall motion abnormalities (Inferior/posterior wall hypokinesis).  Left ventricular diastolic  parameters are consistent with Grade I diastolic dysfunction (impaired  relaxation).   2. Right ventricular systolic function is normal. The right ventricular  size is normal.   3. The mitral valve is normal in structure. Trivial mitral valve  regurgitation. No evidence of mitral stenosis.   4. The aortic valve is normal in structure. Aortic valve regurgitation is  trivial. No aortic stenosis is present.   5. The inferior vena cava is normal in size with greater than 50%  respiratory variability, suggesting right atrial pressure of 3 mmHg. __________   LHC 03/08/2022:   CULPRIT mid RCA lesion is 100% stenosed.  A drug-eluting stent was successfully placed using a STENT ONYX FRONTIER [id number].  Postdilated to 3.1 mm   Post intervention, there is a 0% residual stenosis.   Dist RCA lesion is 20% stenosed.   ----------------------------------   There is mild left ventricular systolic dysfunction. The left ventricular ejection fraction is 45-50% by visual estimate.   LV end diastolic pressure is normal.   There is no aortic valve stenosis.   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 - restored TIMI 3 flow with lesion reduced to 0% Post PTCA reperfusion - prolonged AIVR episodes with hypotension--> short run of Levophed ~ 4 mcg/min, stopped post PCI Relatively normal left coronary System. Relatively preserved LV function with apical inferior hypokinesis. EF roughly 45 to 50%. Normal EDP      RECOMMENDATIONS: Admit to [facility] with [facility] and consultation Echocardiogram ordered.  Troponin cycled. FLP, LP(a) checked-started on high-dose statin Start low-dose beta-blocker Smoking cessation  counseling   EKG:  EKG is ordered today.  The EKG ordered today demonstrates ***  Recent Labs: 03/08/2022: ALT 20; Magnesium 2.2 03/09/2022: BUN 10; Creatinine, Ser 0.67; Hemoglobin 14.8; Platelets 204; Potassium 3.9; Sodium 139  Recent Lipid Panel    Component Value Date/Time   CHOL 218 (H) 03/09/2022 0450   CHOL 195 04/07/2018 1432   TRIG 127 03/09/2022 0450   HDL 51 03/09/2022 0450   HDL 59 04/07/2018 1432   CHOLHDL 4.3 03/09/2022 0450   VLDL 25 03/09/2022 0450   LDLCALC 142 (H) 03/09/2022 0450   LDLCALC 105 (H) 04/07/2018 1432    PHYSICAL EXAM:    VS:  There were no vitals taken for this visit.  BMI: There is no height or weight on file to calculate BMI.  Physical Exam  Wt Readings from Last 3 Encounters:  06/14/22 159 lb 6.4 oz (72.3 kg)  03/22/22 154 lb 9.6 oz (70.1 kg)  03/08/22 134 lb 14.7 oz (61.2 kg)     ASSESSMENT & PLAN:   CAD involving the native coronary arteries with inferior ST EMI complicated by ventricular fibrillation arrest status post PCI/DES to the RCA without***angina:  HTN: Blood pressure  HLD: LDL 142 during hospital admission with a total cholesterol of 218 and an LPA of 189.  Tobacco use:   {Are you ordering a CV Procedure (e.g. stress test, cath, DCCV, TEE, etc)?   Press F2        :[id number]}     Disposition: F/u with Dr. Herbie Baltimore or an APP in ***.   Medication Adjustments/Labs and Tests Ordered: Current medicines are reviewed at length with the patient today.  Concerns regarding medicines are outlined above. Medication changes, Labs and Tests ordered today are summarized above and listed in the Patient Instructions accessible in Encounters.   Signed, Eula Listen, PA-C 07/17/2022 7:57 AM     [facility] [place] [address] [place], [place] [address] [phone]

## 2022-07-18 ENCOUNTER — Encounter: Payer: Self-pay | Admitting: Physician Assistant

## 2022-07-18 DIAGNOSIS — J069 Acute upper respiratory infection, unspecified: Secondary | ICD-10-CM | POA: Diagnosis not present

## 2023-04-24 ENCOUNTER — Other Ambulatory Visit: Payer: Self-pay | Admitting: Cardiology

## 2023-06-17 ENCOUNTER — Other Ambulatory Visit: Payer: Self-pay | Admitting: Physician Assistant

## 2024-04-27 ENCOUNTER — Other Ambulatory Visit: Payer: Self-pay | Admitting: Cardiology

## 2024-04-29 NOTE — Telephone Encounter (Signed)
 Number not in service

## 2024-04-29 NOTE — Telephone Encounter (Signed)
Please contact pt for future appointment. Pt overdue for follow up.
